# Patient Record
Sex: Male | Born: 1965 | Race: White | Hispanic: No | Marital: Married | Smoking: Never smoker
Health system: Southern US, Community
[De-identification: ages and names within clinical notes are randomized; demographics above are authoritative.]

## PROBLEM LIST (undated history)

## (undated) DIAGNOSIS — I10 Essential (primary) hypertension: Secondary | ICD-10-CM

## (undated) HISTORY — PX: VASECTOMY: SHX75

---

## 2012-03-06 LAB — HM COLONOSCOPY

## 2012-11-20 ENCOUNTER — Ambulatory Visit: Payer: Self-pay | Admitting: Family Medicine

## 2014-05-13 LAB — BASIC METABOLIC PANEL
BUN: 10 mg/dL (ref 4–21)
Creatinine: 0.7 mg/dL (ref 0.6–1.3)
GLUCOSE: 91 mg/dL
Potassium: 4.4 mmol/L (ref 3.4–5.3)
Sodium: 140 mmol/L (ref 137–147)

## 2014-05-13 LAB — CBC AND DIFFERENTIAL
HCT: 45 % (ref 41–53)
Hemoglobin: 15.6 g/dL (ref 13.5–17.5)
NEUTROS ABS: 5 /uL
PLATELETS: 380 10*3/uL (ref 150–399)
WBC: 9 10^3/mL

## 2014-05-13 LAB — HEPATIC FUNCTION PANEL
ALK PHOS: 109 U/L (ref 25–125)
ALT: 28 U/L (ref 10–40)
AST: 16 U/L (ref 14–40)
BILIRUBIN, TOTAL: 0.3 mg/dL

## 2014-05-13 LAB — TSH: TSH: 1.51 u[IU]/mL (ref 0.41–5.90)

## 2014-08-13 IMAGING — CR DG CHEST 2V
1 series · 2 of 2 positions shown · non-contrast
Comparison: none

REASON FOR EXAM: cough
COMMENTS:

[Series 1: pa · 0.17mm/px · 2 of 2 slices shown]
[im 1/2]
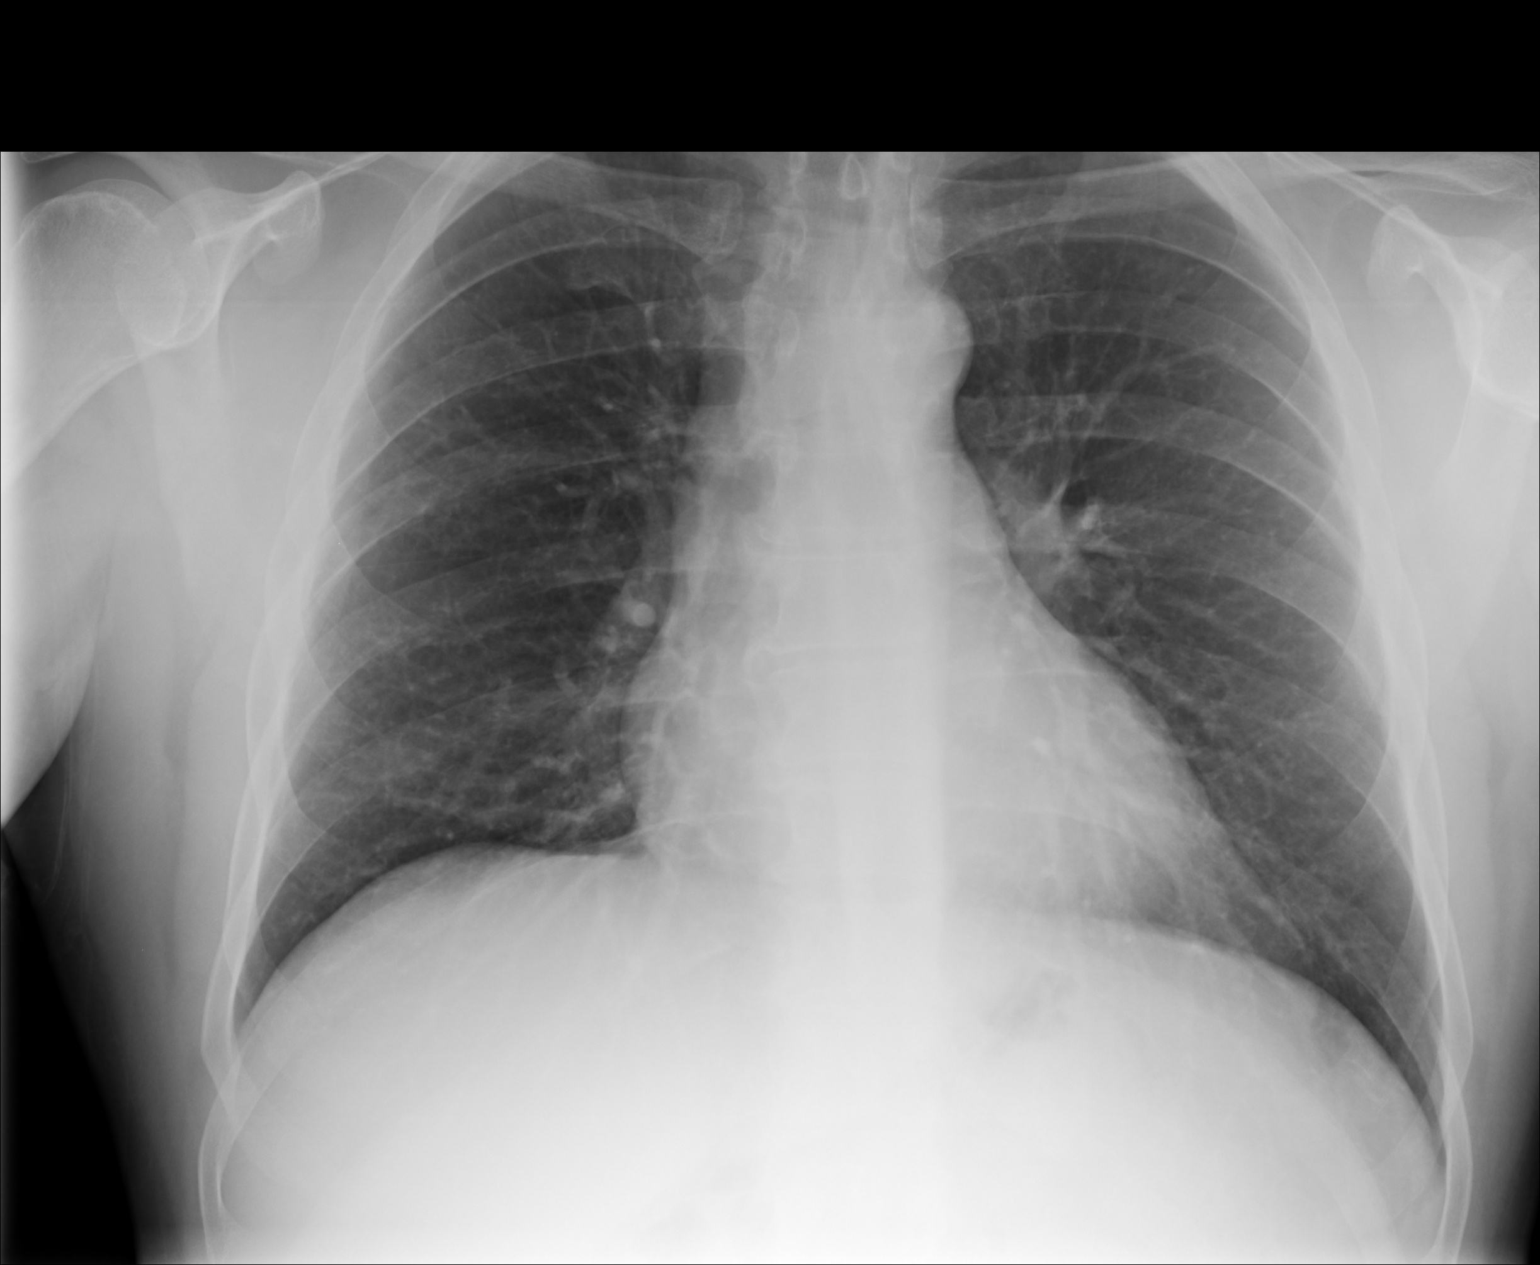
[im 2/2]
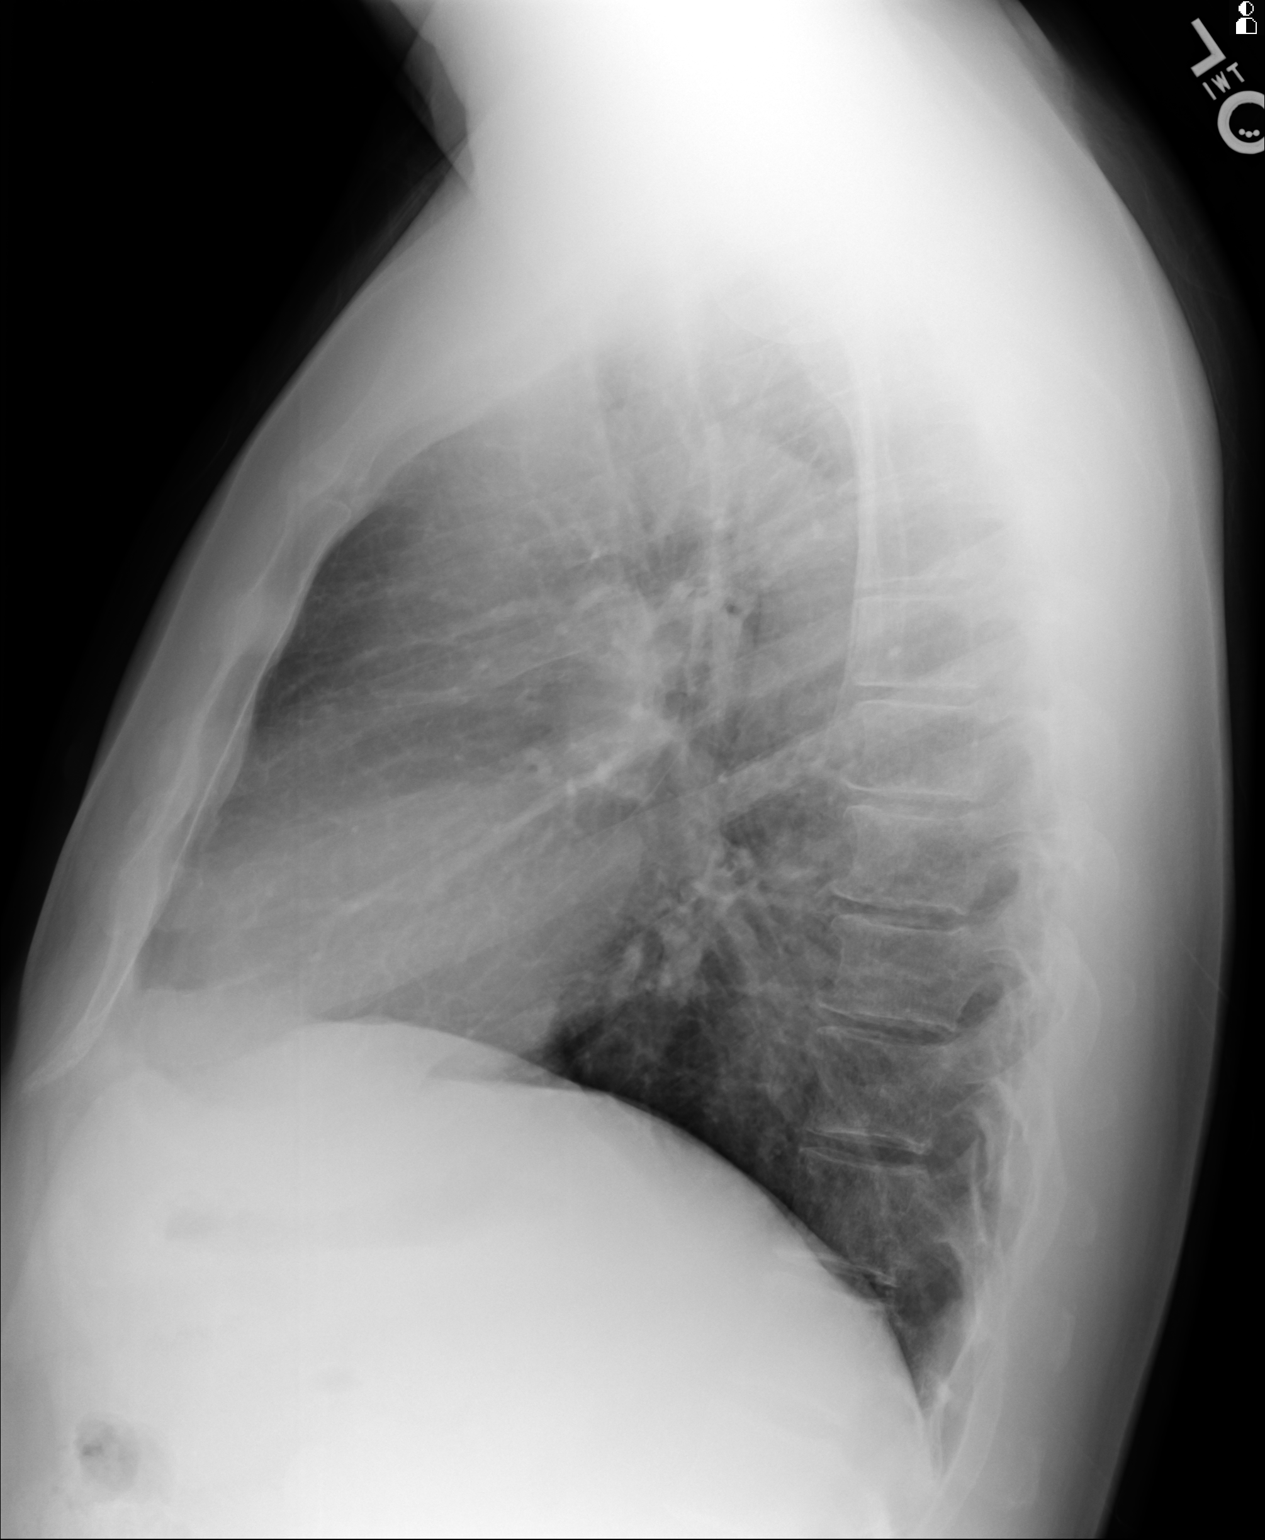

[2 of 2 positions shown; findings below may reference images not displayed]

PROCEDURE:     KDR - KDXR CHEST PA (OR AP) AND LAT  - November 20, 2012  [DATE]

RESULT:     The lungs are adequately inflated. There is no focal infiltrate.
The cardiac silhouette is normal in size. The mediastinum is normal in
width. There is no pleural effusion. The bony thorax exhibits no acute
abnormality.
IMPRESSION: There is no evidence of pneumonia nor CHF. I cannot exclude
acute bronchitis in the appropriate clinical setting.

[REDACTED]

## 2015-05-29 ENCOUNTER — Other Ambulatory Visit: Payer: Self-pay | Admitting: Family Medicine

## 2015-05-29 DIAGNOSIS — I10 Essential (primary) hypertension: Secondary | ICD-10-CM | POA: Insufficient documentation

## 2015-05-29 NOTE — Telephone Encounter (Signed)
Last OV 04/2014  Thanks,   -Tianna Baus  

## 2015-10-09 DIAGNOSIS — Z6827 Body mass index (BMI) 27.0-27.9, adult: Secondary | ICD-10-CM

## 2015-10-09 DIAGNOSIS — N529 Male erectile dysfunction, unspecified: Secondary | ICD-10-CM | POA: Insufficient documentation

## 2015-10-09 DIAGNOSIS — E7800 Pure hypercholesterolemia, unspecified: Secondary | ICD-10-CM | POA: Insufficient documentation

## 2015-10-09 DIAGNOSIS — E663 Overweight: Secondary | ICD-10-CM | POA: Insufficient documentation

## 2015-11-29 ENCOUNTER — Other Ambulatory Visit: Payer: Self-pay | Admitting: Family Medicine

## 2015-11-29 DIAGNOSIS — I10 Essential (primary) hypertension: Secondary | ICD-10-CM

## 2015-12-10 ENCOUNTER — Encounter: Payer: Self-pay | Admitting: Family Medicine

## 2015-12-10 ENCOUNTER — Ambulatory Visit (INDEPENDENT_AMBULATORY_CARE_PROVIDER_SITE_OTHER): Payer: BLUE CROSS/BLUE SHIELD | Admitting: Family Medicine

## 2015-12-10 VITALS — BP 120/84 | HR 84 | Temp 97.8°F | Resp 20 | Ht 68.0 in | Wt 187.0 lb

## 2015-12-10 DIAGNOSIS — Z23 Encounter for immunization: Secondary | ICD-10-CM | POA: Diagnosis not present

## 2015-12-10 DIAGNOSIS — E7800 Pure hypercholesterolemia, unspecified: Secondary | ICD-10-CM | POA: Diagnosis not present

## 2015-12-10 DIAGNOSIS — I10 Essential (primary) hypertension: Secondary | ICD-10-CM | POA: Diagnosis not present

## 2015-12-10 DIAGNOSIS — Z0000 Encounter for general adult medical examination without abnormal findings: Secondary | ICD-10-CM

## 2015-12-10 NOTE — Progress Notes (Signed)
Patient ID: Ryan Ceo., male   DOB: [birth year], 50 y.o.   MRN: [id number]       Patient: Ryan Ayala., Male    DOB: 1966/01/24, 50 y.o.   MRN: [id number] Visit Date: 12/10/2015  Today's Provider: Lorie Phenix, MD   Chief Complaint  Patient presents with  . Annual Exam   Subjective:    Annual physical exam Ryan Leclaire. is a 50 y.o. male who presents today for health maintenance and complete physical. He feels well. He reports exercising daily. Has very physical job at [employer]. Gets 30 to 40k steps in daily. He reports he is sleeping well.  11/29/11 CPE -----------------------------------------------------------------   Review of Systems  Constitutional: Negative.   HENT: Negative.   Eyes: Negative.   Respiratory: Negative.   Cardiovascular: Negative.   Gastrointestinal: Negative.   Endocrine: Negative.   Genitourinary: Negative.   Musculoskeletal: Negative.   Skin: Negative.   Allergic/Immunologic: Negative.   Neurological: Negative.   Hematological: Negative.   Psychiatric/Behavioral: Negative.     Social History      He  reports that he has never smoked. He has never used smokeless tobacco. He reports that he does not drink alcohol or use illicit drugs.       Social History   Social History  . Marital Status: Married    Spouse Name: N/A  . Number of Children: N/A  . Years of Education: N/A   Social History Main Topics  . Smoking status: Never Smoker   . Smokeless tobacco: Never Used  . Alcohol Use: No  . Drug Use: No  . Sexual Activity: Not Asked   Other Topics Concern  . None   Social History Narrative    History reviewed. No pertinent past medical history.   Patient Active Problem List   Diagnosis Date Noted  . Body mass index (BMI) of 27.0-27.9 in adult 10/09/2015  . ED (erectile dysfunction) of organic origin 10/09/2015  . Hypercholesteremia 10/09/2015  . Essential (primary) hypertension 05/29/2015    Past Surgical History  Procedure  Laterality Date  . Vasectomy      Family History        Family Status  Relation Status Death Age  . Mother Alive   . Father Deceased 49  . Sister Alive   . Paternal Grandfather Deceased         His family history includes Cancer in his father; Heart attack in his paternal grandfather; Heart failure in his father.    No Known Allergies  Previous Medications   LISINOPRIL-HYDROCHLOROTHIAZIDE (PRINZIDE,ZESTORETIC) 20-25 MG TABLET    take 1 tablet by mouth once daily    Patient Care Team: Lorie Phenix, MD as PCP - General (Family Medicine)     Objective:   Vitals: BP 120/84 mmHg  Pulse 84  Temp(Src) 97.8 F (36.6 C) (Oral)  Resp 20  Ht  (1.727 m)  Wt 187 lb (84.823 kg)  BMI 28.44 kg/m2   Physical Exam  Constitutional: He is oriented to person, place, and time. He appears well-developed and well-nourished.  HENT:  Head: Normocephalic and atraumatic.  Right Ear: External ear normal.  Left Ear: External ear normal.  Nose: Nose normal.  Mouth/Throat: Oropharynx is clear and moist.  Eyes: Conjunctivae and EOM are normal. Pupils are equal, round, and reactive to light.  Neck: Normal range of motion. Neck supple.  Cardiovascular: Normal rate, regular rhythm and normal heart sounds.   Pulmonary/Chest: Effort normal and  breath sounds normal.  Abdominal: Soft. Bowel sounds are normal.  Musculoskeletal: Normal range of motion.  Neurological: He is alert and oriented to person, place, and time.  Skin: Skin is warm and dry.  Psychiatric: He has a normal mood and affect. His behavior is normal. Judgment and thought content normal.     Depression Screen PHQ 2/9 Scores 12/10/2015  PHQ - 2 Score 0      Assessment & Plan:     Routine Health Maintenance and Physical Exam  Exercise Activities and Dietary recommendations Goals    None      Immunization History  Administered Date(s) Administered  . Tdap 12/10/2015         1. Annual physical exam Stable.  Patient advised to continue eating healthy and exercise daily.  2. Need for Tdap vaccination - Tdap vaccine greater than or equal to 7yo IM  3. Essential (primary) hypertension - CBC with Differential/Platelet - Comprehensive metabolic panel  4. Hypercholesteremia - Lipid Panel With LDL/HDL Ratio - TSH   Patient seen and examined by Dr. Leo GrosserNancy J.. Alinah Sheard, and note scribed by Liz BeachSulibeya S. Dimas, CMA. I have reviewed the document for accuracy and completeness and I agree with above. Leo Grosser- Azaya Goedde J. Kennie Snedden, MD   Lorie PhenixNancy Hermenia Fritcher, MD    --------------------------------------------------------------------

## 2015-12-12 ENCOUNTER — Telehealth: Payer: Self-pay

## 2015-12-12 LAB — COMPREHENSIVE METABOLIC PANEL
ALK PHOS: 108 IU/L (ref 39–117)
ALT: 38 IU/L (ref 0–44)
AST: 20 IU/L (ref 0–40)
Albumin/Globulin Ratio: 1.6 (ref 1.2–2.2)
Albumin: 4.2 g/dL (ref 3.5–5.5)
BILIRUBIN TOTAL: 0.5 mg/dL (ref 0.0–1.2)
BUN/Creatinine Ratio: 18 (ref 9–20)
BUN: 13 mg/dL (ref 6–24)
CHLORIDE: 101 mmol/L (ref 96–106)
CO2: 22 mmol/L (ref 18–29)
Calcium: 9.8 mg/dL (ref 8.7–10.2)
Creatinine, Ser: 0.74 mg/dL — ABNORMAL LOW (ref 0.76–1.27)
GFR calc Af Amer: 126 mL/min/{1.73_m2} (ref >59)
GFR calc non Af Amer: 109 mL/min/{1.73_m2} (ref >59)
GLUCOSE: 95 mg/dL (ref 65–99)
Globulin, Total: 2.7 g/dL (ref 1.5–4.5)
Potassium: 4.7 mmol/L (ref 3.5–5.2)
Sodium: 142 mmol/L (ref 134–144)
Total Protein: 6.9 g/dL (ref 6.0–8.5)

## 2015-12-12 LAB — CBC WITH DIFFERENTIAL/PLATELET
BASOS ABS: 0.1 10*3/uL (ref 0.0–0.2)
Basos: 1 %
EOS (ABSOLUTE): 0.7 10*3/uL — AB (ref 0.0–0.4)
Eos: 8 %
Hematocrit: 47.2 % (ref 37.5–51.0)
Hemoglobin: 16.1 g/dL (ref 12.6–17.7)
Immature Grans (Abs): 0 10*3/uL (ref 0.0–0.1)
Immature Granulocytes: 0 %
LYMPHS ABS: 2.3 10*3/uL (ref 0.7–3.1)
LYMPHS: 25 %
MCH: 31 pg (ref 26.6–33.0)
MCHC: 34.1 g/dL (ref 31.5–35.7)
MCV: 91 fL (ref 79–97)
MONOS ABS: 0.8 10*3/uL (ref 0.1–0.9)
Monocytes: 9 %
NEUTROS ABS: 5.3 10*3/uL (ref 1.4–7.0)
Neutrophils: 57 %
PLATELETS: 368 10*3/uL (ref 150–379)
RBC: 5.19 x10E6/uL (ref 4.14–5.80)
RDW: 13.4 % (ref 12.3–15.4)
WBC: 9.3 10*3/uL (ref 3.4–10.8)

## 2015-12-12 LAB — LIPID PANEL WITH LDL/HDL RATIO
CHOLESTEROL TOTAL: 221 mg/dL — AB (ref 100–199)
HDL: 46 mg/dL (ref >39)
LDL Calculated: 147 mg/dL — ABNORMAL HIGH (ref 0–99)
LDl/HDL Ratio: 3.2 ratio units (ref 0.0–3.6)
Triglycerides: 140 mg/dL (ref 0–149)
VLDL CHOLESTEROL CAL: 28 mg/dL (ref 5–40)

## 2015-12-12 LAB — TSH: TSH: 1.95 u[IU]/mL (ref 0.450–4.500)

## 2015-12-12 NOTE — Telephone Encounter (Signed)
LMTCB 12/12/2015  Thanks,   -Elsworth Ledin  

## 2015-12-12 NOTE — Telephone Encounter (Signed)
Pt is returning call.  WU#[phone]/NFCB#[phone]/MW

## 2015-12-12 NOTE — Telephone Encounter (Signed)
LMTCB 12/12/2015  Thanks,   -Laura  

## 2015-12-12 NOTE — Telephone Encounter (Signed)
-----   Message from Lorie PhenixNancy Maloney, MD sent at 12/12/2015  3:15 PM EDT ----- Labs stable.   Cholesterol is elevated but 10 year risk of heart disease is at 4 %.  Eat healthy, stay active and recheck annually. Thanks.

## 2015-12-17 NOTE — Telephone Encounter (Signed)
Pt advised.   Thanks,   -Hanaa Payes  

## 2016-04-24 ENCOUNTER — Encounter: Payer: Self-pay | Admitting: Gynecology

## 2016-04-24 ENCOUNTER — Ambulatory Visit
Admission: EM | Admit: 2016-04-24 | Discharge: 2016-04-24 | Disposition: A | Discharge status: Home / Self Care | Payer: Self-pay | Attending: Emergency Medicine | Admitting: Emergency Medicine

## 2016-04-24 DIAGNOSIS — Z024 Encounter for examination for driving license: Secondary | ICD-10-CM

## 2016-04-24 DIAGNOSIS — Z029 Encounter for administrative examinations, unspecified: Secondary | ICD-10-CM

## 2016-04-24 HISTORY — DX: Essential (primary) hypertension: I10

## 2016-04-24 LAB — DEPT OF TRANSP DIPSTICK, URINE (ARMC ONLY)
Glucose, UA: NEGATIVE mg/dL
Protein, ur: NEGATIVE mg/dL
Specific Gravity, Urine: 1.015 (ref 1.005–1.030)

## 2016-04-24 NOTE — ED Provider Notes (Signed)
CSN: [id number]     Arrival date & time 04/24/16  [id number] History  [id number] First MD Initiated Contact with Patient 04/24/16 [phone]     Chief Complaint  Patient presents with  . DOT   (Consider location/radiation/quality/duration/timing/severity/associated sxs/prior Treatment) HPI   This a 50 year old male presents for a DOT physical. He drives for [employer]. He has a history of hypertension on medication with adequate control.  Past Medical History:  Diagnosis Date  . Hypertension    Past Surgical History:  Procedure Laterality Date  . VASECTOMY     Family History  Problem Relation Age of Onset  . Heart failure Father   . Cancer Father   . Heart attack Paternal Grandfather    Social History  Substance Use Topics  . Smoking status: Never Smoker  . Smokeless tobacco: Never Used  . Alcohol use No    Review of Systems  All other systems reviewed and are negative.   Allergies  Review of patient's allergies indicates no known allergies.  Home Medications   Prior to Admission medications   Medication Sig Start Date End Date Taking? Authorizing Provider  lisinopril-hydrochlorothiazide (PRINZIDE,ZESTORETIC) 20-25 MG tablet take 1 tablet by mouth once daily 12/01/15  Yes Lorie PhenixNancy Maloney, MD   Meds Ordered and Administered this Visit  Medications - No data to display  BP 135/90 (BP Location: Right Arm)   Pulse 80   Temp 98.6 F (37 C) (Oral)   Resp 16   Ht 5\' 9"  (1.753 m)   Wt 182 lb (82.6 kg)   SpO2 98%   BMI 26.88 kg/m  No data found.   Physical Exam  Constitutional:  See DOT physical sheet  Nursing note and vitals reviewed.   Urgent Care Course   Clinical Course    Procedures (including critical care time)  Labs Review Labs Reviewed  DEPT OF TRANSP DIPSTICK, URINE([facility] ONLY) - Abnormal; Notable for the following:       Result Value   Hgb urine dipstick TRACE (*)    All other components within normal limits    Imaging Review No results  found.   Visual Acuity Review  Right Eye Distance:   Left Eye Distance:   Bilateral Distance:    Right Eye Near: R Near: 2020 Left Eye Near:  L Near: 20/25 Bilateral Near:  20/20 (without corrective lens)      MDM   1. Driver's permit PE (physical examination)    Patient qualifies for one year DOT certificate due to hypertension on medication    Lutricia FeilWilliam P Roemer, PA-C 04/24/16 [id number]

## 2016-04-24 NOTE — ED Triage Notes (Signed)
Patient here for DOT exam.

## 2016-05-26 ENCOUNTER — Other Ambulatory Visit: Payer: Self-pay | Admitting: Family Medicine

## 2016-05-26 DIAGNOSIS — I10 Essential (primary) hypertension: Secondary | ICD-10-CM

## 2017-04-03 ENCOUNTER — Other Ambulatory Visit: Payer: Self-pay | Admitting: Family Medicine

## 2017-04-03 DIAGNOSIS — I10 Essential (primary) hypertension: Secondary | ICD-10-CM

## 2017-04-25 ENCOUNTER — Ambulatory Visit (INDEPENDENT_AMBULATORY_CARE_PROVIDER_SITE_OTHER): Payer: BLUE CROSS/BLUE SHIELD | Admitting: Family Medicine

## 2017-04-25 ENCOUNTER — Encounter: Payer: Self-pay | Admitting: Family Medicine

## 2017-04-25 VITALS — BP 124/86 | HR 76 | Temp 98.1°F | Resp 16 | Ht 69.0 in | Wt 183.0 lb

## 2017-04-25 DIAGNOSIS — Z114 Encounter for screening for human immunodeficiency virus [HIV]: Secondary | ICD-10-CM | POA: Diagnosis not present

## 2017-04-25 DIAGNOSIS — E7800 Pure hypercholesterolemia, unspecified: Secondary | ICD-10-CM | POA: Diagnosis not present

## 2017-04-25 DIAGNOSIS — I10 Essential (primary) hypertension: Secondary | ICD-10-CM

## 2017-04-25 DIAGNOSIS — Z6827 Body mass index (BMI) 27.0-27.9, adult: Secondary | ICD-10-CM | POA: Diagnosis not present

## 2017-04-25 MED ORDER — LISINOPRIL-HYDROCHLOROTHIAZIDE 20-25 MG PO TABS: 1.0000 | ORAL_TABLET | Freq: Every day | ORAL | 2 refills | Status: DC

## 2017-04-25 NOTE — Patient Instructions (Signed)

## 2017-04-25 NOTE — Assessment & Plan Note (Signed)
Discussed diet and exercise 

## 2017-04-25 NOTE — Assessment & Plan Note (Signed)
Well controlled Continue current meds Recheck CMP F/u in 6 months 

## 2017-04-25 NOTE — Assessment & Plan Note (Signed)
No meds currently Recheck lipid panel, CMP

## 2017-04-25 NOTE — Progress Notes (Signed)
Patient: Ryan Ayala. Male    DOB: 1965/08/21   51 y.o.   MRN: [id number] Visit Date: 04/25/2017  Today's Provider: Shirlee Latch, MD   Chief Complaint  Patient presents with  . Hypertension  . Hyperlipidemia   Subjective:    HPI      Hypertension, follow-up:  BP Readings from Last 3 Encounters:  04/25/17 124/86  04/24/16 135/90  12/10/15 120/84    He was last seen for hypertension over 1 year ago.  BP at that visit was 120/84. Management since that visit includes checking labs, which were stable. He reports good compliance with treatment. He is not having side effects.  He is exercising. Daily. Active at work as a Optician, dispensing. He is not adherent to low salt diet.   Outside blood pressures are not being checked. He is experiencing none.  Patient denies chest pain, chest pressure/discomfort, claudication, dyspnea, exertional chest pressure/discomfort, fatigue, irregular heart beat, lower extremity edema, near-syncope, orthopnea, palpitations and syncope.   Cardiovascular risk factors include dyslipidemia, hypertension and male gender.  Use of agents associated with hypertension: none.     Weight trend: fluctuating a bit Wt Readings from Last 3 Encounters:  04/25/17 183 lb (83 kg)  04/24/16 182 lb (82.6 kg)  12/10/15 187 lb (84.8 kg)    Current diet: in general, a "healthy" diet    ------------------------------------------------------------------------  Lipid/Cholesterol, Follow-up:   Last seen for this over 1 year ago.  Management changes since that visit include checking labs. Lipids were elevated, but ASCVD was 4%. No changes made. . Last Lipid Panel:    Component Value Date/Time   CHOL 221 (H) 12/11/2015 0850   TRIG 140 12/11/2015 0850   HDL 46 12/11/2015 0850   LDLCALC 147 (H) 12/11/2015 0850    Risk factors for vascular disease include hypercholesterolemia and  hypertension  -------------------------------------------------------------------   No Known Allergies   Current Outpatient Prescriptions:  .  lisinopril-hydrochlorothiazide (PRINZIDE,ZESTORETIC) 20-25 MG tablet, TAKE 1 TABLET BY MOUTH ONCE DAILY, Disp: 30 tablet, Rfl: 0  Review of Systems  Constitutional: Negative for activity change, appetite change, chills, diaphoresis, fatigue, fever and unexpected weight change.  HENT: Negative.   Respiratory: Negative for cough, chest tightness and shortness of breath.   Cardiovascular: Negative for chest pain, palpitations and leg swelling.  Gastrointestinal: Negative.   Endocrine: Negative.   Genitourinary: Negative.   Musculoskeletal: Negative.   Neurological: Negative.   Psychiatric/Behavioral: Negative.     Social History  Substance Use Topics  . Smoking status: Never Smoker  . Smokeless tobacco: Never Used  . Alcohol use Yes     Comment: rarely   Objective:   BP 124/86 (BP Location: Left Arm, Patient Position: Sitting, Cuff Size: Normal)   Pulse 76   Temp 98.1 F (36.7 C) (Oral)   Resp 16   Ht  (1.753 m)   Wt 183 lb (83 kg)   BMI 27.02 kg/m  Vitals:   04/25/17 0815  BP: 124/86  Pulse: 76  Resp: 16  Temp: 98.1 F (36.7 C)  TempSrc: Oral  Weight: 183 lb (83 kg)  Height:  (1.753 m)     Physical Exam  Constitutional: He is oriented to person, place, and time. He appears well-developed and well-nourished. No distress.  HENT:  Head: Normocephalic and atraumatic.  Right Ear: External ear normal.  Left Ear: External ear normal.  Nose: Nose normal.  Eyes: Conjunctivae are normal. No scleral icterus.  Cardiovascular: Normal  rate, regular rhythm, normal heart sounds and intact distal pulses.   No murmur heard. Pulmonary/Chest: Effort normal and breath sounds normal. No respiratory distress. He has no wheezes. He has no rales.  Musculoskeletal: He exhibits no edema or deformity.  Neurological: He is oriented  to person, place, and time.  Skin: Skin is warm and dry. No rash noted.  Psychiatric: He has a normal mood and affect. His behavior is normal.  Vitals reviewed.      Assessment & Plan:      Problem List Items Addressed This Visit      Cardiovascular and Mediastinum   Essential (primary) hypertension    Well controlled Continue current meds Recheck CMP F/u in 6 months      Relevant Medications   sildenafil (VIAGRA) 50 MG tablet   lisinopril-hydrochlorothiazide (PRINZIDE,ZESTORETIC) 20-25 MG tablet   Other Relevant Orders   Comprehensive metabolic panel     Other   Body mass index (BMI) of 27.0-27.9 in adult    Discussed diet and exercise      Hypercholesteremia - Primary    No meds currently Recheck lipid panel, CMP      Relevant Medications   sildenafil (VIAGRA) 50 MG tablet   lisinopril-hydrochlorothiazide (PRINZIDE,ZESTORETIC) 20-25 MG tablet   Other Relevant Orders   Lipid panel   Comprehensive metabolic panel    Other Visit Diagnoses    Screening for HIV (human immunodeficiency virus)       Relevant Orders   HIV antibody (with reflex)          The entirety of the information documented in the History of Present Illness, Review of Systems and Physical Exam were personally obtained by me. Portions of this information were initially documented by Irving BurtonEmily Ratchford, CMA and reviewed by me for thoroughness and accuracy.     Shirlee LatchAngela Ceanna Wareing, MD  [facility]

## 2017-04-26 ENCOUNTER — Telehealth: Payer: Self-pay

## 2017-04-26 LAB — HIV ANTIBODY (ROUTINE TESTING W REFLEX): HIV 1&2 Ab, 4th Generation: NONREACTIVE

## 2017-04-26 LAB — LIPID PANEL
CHOL/HDL RATIO: 4.9 (calc) (ref <5.0)
CHOLESTEROL: 209 mg/dL — AB (ref <200)
HDL: 43 mg/dL (ref >40)
LDL CHOLESTEROL (CALC): 142 mg/dL — AB
Non-HDL Cholesterol (Calc): 166 mg/dL (calc) — ABNORMAL HIGH (ref <130)
TRIGLYCERIDES: 122 mg/dL (ref <150)

## 2017-04-26 LAB — COMPREHENSIVE METABOLIC PANEL
AG Ratio: 1.5 (calc) (ref 1.0–2.5)
ALT: 28 U/L (ref 9–46)
AST: 17 U/L (ref 10–35)
Albumin: 4.1 g/dL (ref 3.6–5.1)
Alkaline phosphatase (APISO): 101 U/L (ref 40–115)
BUN: 11 mg/dL (ref 7–25)
CO2: 28 mmol/L (ref 20–32)
Calcium: 9.6 mg/dL (ref 8.6–10.3)
Chloride: 103 mmol/L (ref 98–110)
Creat: 0.7 mg/dL (ref 0.70–1.33)
Globulin: 2.8 g/dL (calc) (ref 1.9–3.7)
Glucose, Bld: 91 mg/dL (ref 65–99)
Potassium: 4.3 mmol/L (ref 3.5–5.3)
Sodium: 137 mmol/L (ref 135–146)
Total Bilirubin: 0.6 mg/dL (ref 0.2–1.2)
Total Protein: 6.9 g/dL (ref 6.1–8.1)

## 2017-04-26 NOTE — Telephone Encounter (Signed)
Pt advised of results. 

## 2017-04-26 NOTE — Telephone Encounter (Signed)
-----   Message from Erasmo DownerAngela M Bacigalupo, MD sent at 04/26/2017 10:27 AM EDT ----- Cholesterol is pretty good but slightly elevated.  10 year risk of heart disease/stroke is low at 2.3%. No statin medication necessary at this time.  Normal kidney function, liver function, electrolytes.  HIV screen negative.  Erasmo DownerBacigalupo, Angela M, MD, MPH [facility] 04/26/2017 10:27 AM

## 2017-04-28 ENCOUNTER — Encounter: Payer: Self-pay | Admitting: Family Medicine

## 2017-10-24 ENCOUNTER — Encounter: Payer: Self-pay | Admitting: Family Medicine

## 2017-10-24 ENCOUNTER — Ambulatory Visit (INDEPENDENT_AMBULATORY_CARE_PROVIDER_SITE_OTHER): Payer: BLUE CROSS/BLUE SHIELD | Admitting: Family Medicine

## 2017-10-24 VITALS — BP 136/84 | HR 86 | Temp 98.2°F | Resp 16 | Ht 69.0 in | Wt 187.0 lb

## 2017-10-24 DIAGNOSIS — I10 Essential (primary) hypertension: Secondary | ICD-10-CM

## 2017-10-24 DIAGNOSIS — Z6827 Body mass index (BMI) 27.0-27.9, adult: Secondary | ICD-10-CM

## 2017-10-24 DIAGNOSIS — Z0000 Encounter for general adult medical examination without abnormal findings: Secondary | ICD-10-CM | POA: Diagnosis not present

## 2017-10-24 NOTE — Progress Notes (Signed)
Patient: Ryan Ayala., Male    DOB: [birth year], 52 y.o.   MRN: [id number] Visit Date: 10/24/2017  Today's Provider: Shirlee Latch, MD   I, Joslyn Hy, CMA, am acting as scribe for Shirlee Latch, MD.  Chief Complaint  Patient presents with  . Annual Exam   Subjective:    Annual physical exam Ryan Ayala. is a 52 y.o. male who presents today for health maintenance and complete physical. He feels well. He reports he is not exercising regularly, but has a physically strenuous job.Marland Kitchen He reports he is sleeping well.  Last colonoscopy- 03/06/2012- entire examined colon was normal. -----------------------------------------------------------------   Review of Systems  Constitutional: Negative.   HENT: Negative.   Eyes: Negative.   Respiratory: Negative.   Cardiovascular: Negative.   Gastrointestinal: Negative.   Endocrine: Negative.   Genitourinary: Negative.   Musculoskeletal: Negative.   Skin: Negative.   Allergic/Immunologic: Negative.   Neurological: Negative.   Hematological: Negative.   Psychiatric/Behavioral: Negative.     Social History      He  reports that  has never smoked. he has never used smokeless tobacco. He reports that he drinks alcohol. He reports that he does not use drugs.       Social History   Socioeconomic History  . Marital status: Married    Spouse name: Zella Ball  . Number of children: 3  . Years of education: 95  . Highest education level: High school graduate  Social Needs  . Financial resource strain: Not hard at all  . Food insecurity - worry: Never true  . Food insecurity - inability: Never true  . Transportation needs - medical: No  . Transportation needs - non-medical: No  Occupational History  . None  Tobacco Use  . Smoking status: Never Smoker  . Smokeless tobacco: Never Used  Substance and Sexual Activity  . Alcohol use: Yes    Comment: rarely  . Drug use: No  . Sexual activity: Yes    Birth  control/protection: Surgical  Other Topics Concern  . None  Social History Narrative  . None    Past Medical History:  Diagnosis Date  . Hypertension      Patient Active Problem List   Diagnosis Date Noted  . Body mass index (BMI) of 27.0-27.9 in adult 10/09/2015  . ED (erectile dysfunction) of organic origin 10/09/2015  . Hypercholesteremia 10/09/2015  . Essential (primary) hypertension 05/29/2015    Past Surgical History:  Procedure Laterality Date  . VASECTOMY      Family History        Family Status  Relation Name Status  . Mother  Alive  . Father  Deceased at age 36  . Sister  Alive  . PGF  Deceased  . Neg Hx  (Not Specified)        His family history includes Cancer in his father; Emphysema in his mother; Healthy in his sister; Heart attack (age of onset: 46) in his paternal grandfather; Heart failure in his father. There is no history of Colon cancer or Prostate cancer.      No Known Allergies   Current Outpatient Medications:  .  lisinopril-hydrochlorothiazide (PRINZIDE,ZESTORETIC) 20-25 MG tablet, Take 1 tablet by mouth daily., Disp: 90 tablet, Rfl: 2 .  sildenafil (VIAGRA) 50 MG tablet, Take 50 mg by mouth daily as needed., Disp: , Rfl:    Patient Care Team: Erasmo Downer, MD as PCP - General (Family Medicine)  Objective:   Vitals: BP 136/84 (BP Location: Left Arm, Patient Position: Sitting, Cuff Size: Large)   Pulse 86   Temp 98.2 F (36.8 C) (Oral)   Resp 16   Ht 5\' 9"  (1.753 m)   Wt 187 lb (84.8 kg)   SpO2 98%   BMI 27.62 kg/m    Vitals:   10/24/17 1003  BP: 136/84  Pulse: 86  Resp: 16  Temp: 98.2 F (36.8 C)  TempSrc: Oral  SpO2: 98%  Weight: 187 lb (84.8 kg)  Height: 5\' 9"  (1.753 m)     Physical Exam  Constitutional: He is oriented to person, place, and time. He appears well-developed and well-nourished. No distress.  HENT:  Head: Normocephalic and atraumatic.  Right Ear: External ear normal.  Left Ear:  External ear normal.  Nose: Nose normal.  Mouth/Throat: Oropharynx is clear and moist. No oropharyngeal exudate.  Eyes: Conjunctivae and EOM are normal. Pupils are equal, round, and reactive to light. Right eye exhibits no discharge. Left eye exhibits no discharge. No scleral icterus.  Neck: Neck supple. No thyromegaly present.  Cardiovascular: Normal rate, regular rhythm, normal heart sounds and intact distal pulses.  No murmur heard. Pulmonary/Chest: Effort normal and breath sounds normal. No respiratory distress. He has no wheezes. He has no rales.  Abdominal: Soft. He exhibits no distension. There is no tenderness.  Musculoskeletal: He exhibits no edema.  Lymphadenopathy:    He has no cervical adenopathy.  Neurological: He is alert and oriented to person, place, and time.  Skin: Skin is warm and dry. No rash noted.  Psychiatric: He has a normal mood and affect. His behavior is normal.  Vitals reviewed.    Depression Screen PHQ 2/9 Scores 10/24/2017 04/25/2017 12/10/2015  PHQ - 2 Score 0 0 0     Assessment & Plan:     Routine Health Maintenance and Physical Exam  Exercise Activities and Dietary recommendations Goals    None      Immunization History  Administered Date(s) Administered  . Tdap 12/10/2015    Health Maintenance  Topic Date Due  . INFLUENZA VACCINE  11/13/2017 (Originally 03/16/2017)  . COLONOSCOPY  03/06/2022  . TETANUS/TDAP  12/09/2025  . HIV Screening  Completed     Discussed health benefits of physical activity, and encouraged him to engage in regular exercise appropriate for his age and condition.    -------------------------------------------------------------------- Problem List Items Addressed This Visit      Cardiovascular and Mediastinum   Essential (primary) hypertension   Relevant Orders   Basic Metabolic Panel (BMET)     Other   Body mass index (BMI) of 27.0-27.9 in adult    Other Visit Diagnoses    Encounter for annual physical  exam    -  Primary       Return in about 6 months (around 04/26/2018) for BP, cholesterol f/u.   The entirety of the information documented in the History of Present Illness, Review of Systems and Physical Exam were personally obtained by me. Portions of this information were initially documented by Irving BurtonEmily Ratchford, CMA and reviewed by me for thoroughness and accuracy.    Erasmo DownerBacigalupo, Angela M, MD, MPH [facility] 10/24/2017 10:28 AM

## 2017-10-24 NOTE — Patient Instructions (Signed)

## 2017-10-25 ENCOUNTER — Telehealth: Payer: Self-pay

## 2017-10-25 LAB — BASIC METABOLIC PANEL
BUN / CREAT RATIO: 19 (ref 9–20)
BUN: 13 mg/dL (ref 6–24)
CHLORIDE: 101 mmol/L (ref 96–106)
CO2: 23 mmol/L (ref 20–29)
Calcium: 9.8 mg/dL (ref 8.7–10.2)
Creatinine, Ser: 0.68 mg/dL — ABNORMAL LOW (ref 0.76–1.27)
GFR calc non Af Amer: 111 mL/min/{1.73_m2} (ref >59)
GFR, EST AFRICAN AMERICAN: 129 mL/min/{1.73_m2} (ref >59)
GLUCOSE: 83 mg/dL (ref 65–99)
Potassium: 4.3 mmol/L (ref 3.5–5.2)
SODIUM: 139 mmol/L (ref 134–144)

## 2017-10-25 NOTE — Telephone Encounter (Signed)
-----   Message from Erasmo DownerAngela M Bacigalupo, MD sent at 10/25/2017  8:13 AM EDT ----- Normal kidney function, electrolytes.  Erasmo DownerBacigalupo, Angela M, MD, MPH [facility] 10/25/2017 8:13 AM

## 2017-10-25 NOTE — Telephone Encounter (Signed)
Pt advised.

## 2018-03-07 ENCOUNTER — Other Ambulatory Visit: Payer: Self-pay | Admitting: Family Medicine

## 2018-03-07 DIAGNOSIS — I10 Essential (primary) hypertension: Secondary | ICD-10-CM

## 2018-06-19 ENCOUNTER — Ambulatory Visit: Payer: BLUE CROSS/BLUE SHIELD | Admitting: Family Medicine

## 2018-06-19 ENCOUNTER — Encounter: Payer: Self-pay | Admitting: Family Medicine

## 2018-06-19 VITALS — BP 138/82 | HR 100 | Temp 98.1°F | Wt 187.6 lb

## 2018-06-19 DIAGNOSIS — I10 Essential (primary) hypertension: Secondary | ICD-10-CM | POA: Diagnosis not present

## 2018-06-19 DIAGNOSIS — Z6827 Body mass index (BMI) 27.0-27.9, adult: Secondary | ICD-10-CM | POA: Diagnosis not present

## 2018-06-19 DIAGNOSIS — N529 Male erectile dysfunction, unspecified: Secondary | ICD-10-CM | POA: Diagnosis not present

## 2018-06-19 DIAGNOSIS — E7800 Pure hypercholesterolemia, unspecified: Secondary | ICD-10-CM

## 2018-06-19 NOTE — Assessment & Plan Note (Signed)
Well controlled Continue current meds Check CMP F/u in 6 months 

## 2018-06-19 NOTE — Assessment & Plan Note (Signed)
Stable Continue viagra prn 

## 2018-06-19 NOTE — Progress Notes (Signed)
Patient: Ryan Ayala. Male    DOB: [birth year]   52 y.o.   MRN: [id number] Visit Date: 06/19/2018  Today's Provider: Shirlee Latch, MD   Chief Complaint  Patient presents with  . Hypertension  . Hyperlipidemia   Subjective:    HPI  Hypertension, follow-up:  BP Readings from Last 3 Encounters:  06/19/18 138/82  10/24/17 136/84  04/25/17 124/86    He was last seen for hypertension 6 months ago. BP at that visit was 136/84. Management since that visit includes continue current medications He reports good compliance with treatment. He is not having side effects.  He is exercising. Active at work as a Optician, dispensing. He is not adherent to low salt diet.   Outside blood pressures are not being checked. He is experiencing none.  Patient denies chest pain, chest pressure/discomfort, claudication, dyspnea, exertional chest pressure/discomfort, fatigue, irregular heart beat, lower extremity edema, near-syncope, orthopnea, palpitations and syncope.   Cardiovascular risk factors include dyslipidemia, hypertension and male gender.  Use of agents associated with hypertension: none.                Weight trend: fluctuating a bit Wt Readings from Last 3 Encounters:  06/19/18 187 lb 9.6 oz (85.1 kg)  10/24/17 187 lb (84.8 kg)  04/25/17 183 lb (83 kg)    Current diet: in general, a "healthy" diet    ------------------------------------------------------------------------  Lipid/Cholesterol, Follow-up:   Last seen for this over 6 months ago Management changes since that visit include checking labs.  Last Lipid Panel: Lab Results  Component Value Date   CHOL 209 (H) 04/25/2017   HDL 43 04/25/2017   LDLCALC 142 (H) 04/25/2017   TRIG 122 04/25/2017   CHOLHDL 4.9 04/25/2017    Risk factors for vascular disease include hypercholesterolemia and hypertension  -------------------------------------------------------------------   No Known  Allergies   Current Outpatient Medications:  .  lisinopril-hydrochlorothiazide (PRINZIDE,ZESTORETIC) 20-25 MG tablet, TAKE 1 TABLET BY MOUTH DAILY, Disp: 90 tablet, Rfl: 3 .  sildenafil (VIAGRA) 50 MG tablet, Take 50 mg by mouth daily as needed., Disp: , Rfl:   Review of Systems  Constitutional: Negative.   Respiratory: Negative.   Cardiovascular: Negative.   Gastrointestinal: Negative.   Genitourinary: Negative.   Musculoskeletal: Negative.     Social History   Tobacco Use  . Smoking status: Never Smoker  . Smokeless tobacco: Never Used  Substance Use Topics  . Alcohol use: Yes    Comment: rarely   Objective:   BP 138/82 (BP Location: Right Arm, Patient Position: Sitting, Cuff Size: Normal)   Pulse 100   Temp 98.1 F (36.7 C) (Oral)   Wt 187 lb 9.6 oz (85.1 kg)   SpO2 98%   BMI 27.70 kg/m  Vitals:   06/19/18 0806  BP: 138/82  Pulse: 100  Temp: 98.1 F (36.7 C)  TempSrc: Oral  SpO2: 98%  Weight: 187 lb 9.6 oz (85.1 kg)     Physical Exam  Constitutional: He is oriented to person, place, and time. He appears well-developed and well-nourished. No distress.  HENT:  Head: Normocephalic and atraumatic.  Eyes: Conjunctivae are normal. No scleral icterus.  Neck: Neck supple. No thyromegaly present.  Cardiovascular: Normal rate, regular rhythm, normal heart sounds and intact distal pulses.  No murmur heard. Pulmonary/Chest: Effort normal and breath sounds normal. No respiratory distress. He has no wheezes. He has no rales.  Musculoskeletal: He exhibits no edema.  Lymphadenopathy:    He  has no cervical adenopathy.  Neurological: He is alert and oriented to person, place, and time.  Skin: Skin is warm and dry. Capillary refill takes less than 2 seconds. No rash noted.  Psychiatric: He has a normal mood and affect. His behavior is normal.  Vitals reviewed.       Assessment & Plan:   Problem List Items Addressed This Visit      Cardiovascular and Mediastinum    Essential (primary) hypertension - Primary    Well controlled Continue current meds Check CMP F/u in 6 months      Relevant Orders   Comprehensive metabolic panel     Genitourinary   ED (erectile dysfunction) of organic origin    Stable Continue viagra prn        Other   Body mass index (BMI) of 27.0-27.9 in adult    Discussed diet and exercise and goal of 5-10% weight loss in next year      Hypercholesteremia    No meds currently  Last ASCVD risk 2.3% Recheck CMP and FLP      Relevant Orders   Comprehensive metabolic panel   Lipid panel       Return in about 6 months (around 12/18/2018) for CPE.   The entirety of the information documented in the History of Present Illness, Review of Systems and Physical Exam were personally obtained by me. Portions of this information were initially documented by Presley Raddle, CMA and reviewed by me for thoroughness and accuracy.    Erasmo Downer, MD, MPH [facility] 06/19/2018 9:01 AM

## 2018-06-19 NOTE — Patient Instructions (Signed)

## 2018-06-19 NOTE — Assessment & Plan Note (Signed)
No meds currently  Last ASCVD risk 2.3% Recheck CMP and FLP

## 2018-06-19 NOTE — Assessment & Plan Note (Signed)
Discussed diet and exercise and goal of 5-10% weight loss in next year

## 2018-06-20 LAB — COMPREHENSIVE METABOLIC PANEL
ALBUMIN: 4.2 g/dL (ref 3.5–5.5)
ALK PHOS: 111 IU/L (ref 39–117)
ALT: 40 IU/L (ref 0–44)
AST: 25 IU/L (ref 0–40)
Albumin/Globulin Ratio: 1.5 (ref 1.2–2.2)
BILIRUBIN TOTAL: 0.4 mg/dL (ref 0.0–1.2)
BUN / CREAT RATIO: 15 (ref 9–20)
BUN: 10 mg/dL (ref 6–24)
CHLORIDE: 103 mmol/L (ref 96–106)
CO2: 20 mmol/L (ref 20–29)
CREATININE: 0.68 mg/dL — AB (ref 0.76–1.27)
Calcium: 9.6 mg/dL (ref 8.7–10.2)
GFR calc non Af Amer: 111 mL/min/{1.73_m2} (ref >59)
GFR, EST AFRICAN AMERICAN: 128 mL/min/{1.73_m2} (ref >59)
Globulin, Total: 2.8 g/dL (ref 1.5–4.5)
Glucose: 83 mg/dL (ref 65–99)
Potassium: 4.4 mmol/L (ref 3.5–5.2)
SODIUM: 140 mmol/L (ref 134–144)
Total Protein: 7 g/dL (ref 6.0–8.5)

## 2018-06-20 LAB — LIPID PANEL
CHOLESTEROL TOTAL: 206 mg/dL — AB (ref 100–199)
Chol/HDL Ratio: 5.3 ratio — ABNORMAL HIGH (ref 0.0–5.0)
HDL: 39 mg/dL — ABNORMAL LOW (ref >39)
LDL CALC: 134 mg/dL — AB (ref 0–99)
Triglycerides: 167 mg/dL — ABNORMAL HIGH (ref 0–149)
VLDL CHOLESTEROL CAL: 33 mg/dL (ref 5–40)

## 2018-10-23 ENCOUNTER — Encounter: Payer: BLUE CROSS/BLUE SHIELD | Admitting: Family Medicine

## 2018-12-25 ENCOUNTER — Encounter: Payer: BLUE CROSS/BLUE SHIELD | Admitting: Family Medicine

## 2019-03-09 ENCOUNTER — Other Ambulatory Visit: Payer: Self-pay | Admitting: Family Medicine

## 2019-03-09 DIAGNOSIS — I10 Essential (primary) hypertension: Secondary | ICD-10-CM

## 2019-04-03 ENCOUNTER — Ambulatory Visit (INDEPENDENT_AMBULATORY_CARE_PROVIDER_SITE_OTHER): Payer: BC Managed Care – PPO | Admitting: Family Medicine

## 2019-04-03 ENCOUNTER — Other Ambulatory Visit: Payer: Self-pay

## 2019-04-03 VITALS — BP 128/82 | HR 88 | Temp 98.2°F | Ht 69.0 in | Wt 180.0 lb

## 2019-04-03 DIAGNOSIS — E663 Overweight: Secondary | ICD-10-CM

## 2019-04-03 DIAGNOSIS — Z0000 Encounter for general adult medical examination without abnormal findings: Secondary | ICD-10-CM | POA: Diagnosis not present

## 2019-04-03 DIAGNOSIS — I10 Essential (primary) hypertension: Secondary | ICD-10-CM

## 2019-04-03 DIAGNOSIS — N529 Male erectile dysfunction, unspecified: Secondary | ICD-10-CM | POA: Diagnosis not present

## 2019-04-03 DIAGNOSIS — E7800 Pure hypercholesterolemia, unspecified: Secondary | ICD-10-CM | POA: Diagnosis not present

## 2019-04-03 NOTE — Assessment & Plan Note (Signed)
Well controlled Continue current medications Recheck metabolic panel F/u in 6 months  

## 2019-04-03 NOTE — Assessment & Plan Note (Signed)
Not on statin Recheck CMP and FLP Calculate ASCVD risk to determine need for statin for primary prevention

## 2019-04-03 NOTE — Progress Notes (Signed)
Patient: Ryan CeoGary L Pherigo Jr., Male    DOB: 03/16/1966, 53 y.o.   MRN: [id number] Visit Date: 04/03/2019  Today's Provider: Shirlee LatchAngela Rishab Stoudt, MD   Chief Complaint  Patient presents with  . Annual Exam   Subjective:  I, Porsha McClurkin CMA, am acting as a scribe for Shirlee LatchAngela Magan Winnett, MD.    Annual physical exam Ryan CeoGary L Wyke Jr. is a 53 y.o. male who presents today for health maintenance and complete physical. He feels well. He reports exercising walking. He reports he is sleeping well.  ----------------------------------------------------------------- Last colonoscopy: 03/06/2012 - Normal - repeat in 10 years  Review of Systems  Constitutional: Negative.   HENT: Negative.   Eyes: Negative.   Respiratory: Negative.   Cardiovascular: Negative.   Gastrointestinal: Negative.   Endocrine: Negative.   Genitourinary: Negative.   Musculoskeletal: Negative.   Skin: Negative.   Allergic/Immunologic: Negative.   Neurological: Negative.   Hematological: Negative.   Psychiatric/Behavioral: Negative.     Social History He  reports that he has never smoked. He has never used smokeless tobacco. He reports current alcohol use. He reports that he does not use drugs. Social History   Socioeconomic History  . Marital status: Married    Spouse name: Ryan BallRobin  . Number of children: 3  . Years of education: 7112  . Highest education level: High school graduate  Occupational History  . Not on file  Social Needs  . Financial resource strain: Not hard at all  . Food insecurity    Worry: Never true    Inability: Never true  . Transportation needs    Medical: No    Non-medical: No  Tobacco Use  . Smoking status: Never Smoker  . Smokeless tobacco: Never Used  Substance and Sexual Activity  . Alcohol use: Yes    Comment: rarely  . Drug use: No  . Sexual activity: Yes    Birth control/protection: Surgical  Lifestyle  . Physical activity    Days per week: Not on file    Minutes per  session: Not on file  . Stress: Not on file  Relationships  . Social Musicianconnections    Talks on phone: Not on file    Gets together: Not on file    Attends religious service: Not on file    Active member of club or organization: Not on file    Attends meetings of clubs or organizations: Not on file    Relationship status: Not on file  Other Topics Concern  . Not on file  Social History Narrative  . Not on file    Patient Active Problem List   Diagnosis Date Noted  . Overweight 10/09/2015  . ED (erectile dysfunction) of organic origin 10/09/2015  . Hypercholesteremia 10/09/2015  . Essential (primary) hypertension 05/29/2015    Past Surgical History:  Procedure Laterality Date  . VASECTOMY      Family History  Family Status  Relation Name Status  . Mother  Alive  . Father  Deceased at age 52  . Sister  Alive  . PGF  Deceased  . Neg Hx  (Not Specified)   His family history includes Cancer in his father; Emphysema in his mother; Healthy in his sister; Heart attack (age of onset: 1256) in his paternal grandfather; Heart failure in his father.     No Known Allergies  Previous Medications   LISINOPRIL-HYDROCHLOROTHIAZIDE (ZESTORETIC) 20-25 MG TABLET    TAKE 1 TABLET BY MOUTH DAILY   SILDENAFIL (VIAGRA)  50 MG TABLET    Take 50 mg by mouth daily as needed.    Patient Care Team: Erasmo DownerBacigalupo, Sholom Dulude M, MD as PCP - General (Family Medicine)      Objective:   Vitals: BP 128/82 (BP Location: Left Arm, Patient Position: Sitting, Cuff Size: Normal)   Pulse 88   Temp 98.2 F (36.8 C) (Oral)   Ht 5\' 9"  (1.753 m)   Wt 180 lb (81.6 kg)   SpO2 97%   BMI 26.58 kg/m    Physical Exam Vitals signs reviewed.  Constitutional:      General: He is not in acute distress.    Appearance: Normal appearance. He is well-developed. He is not diaphoretic.  HENT:     Head: Normocephalic and atraumatic.     Right Ear: Tympanic membrane, ear canal and external ear normal.     Left Ear:  Tympanic membrane, ear canal and external ear normal.     Nose: Nose normal.     Mouth/Throat:     Mouth: Mucous membranes are moist.     Pharynx: Oropharynx is clear. No oropharyngeal exudate.  Eyes:     General: No scleral icterus.    Conjunctiva/sclera: Conjunctivae normal.     Pupils: Pupils are equal, round, and reactive to light.  Neck:     Musculoskeletal: Neck supple.     Thyroid: No thyromegaly.  Cardiovascular:     Rate and Rhythm: Normal rate and regular rhythm.     Heart sounds: Normal heart sounds. No murmur.  Pulmonary:     Effort: Pulmonary effort is normal. No respiratory distress.     Breath sounds: Normal breath sounds. No wheezing or rales.  Abdominal:     General: There is no distension.     Palpations: Abdomen is soft.     Tenderness: There is no abdominal tenderness.  Musculoskeletal:        General: No deformity.     Right lower leg: No edema.     Left lower leg: No edema.  Lymphadenopathy:     Cervical: No cervical adenopathy.  Skin:    General: Skin is warm and dry.     Capillary Refill: Capillary refill takes less than 2 seconds.     Findings: No rash.  Neurological:     Mental Status: He is alert and oriented to person, place, and time.  Psychiatric:        Mood and Affect: Mood normal.        Behavior: Behavior normal.        Thought Content: Thought content normal.      Depression Screen PHQ 2/9 Scores 04/03/2019 10/24/2017 04/25/2017 12/10/2015  PHQ - 2 Score 0 0 0 0  PHQ- 9 Score 0 - - -      Assessment & Plan:     Routine Health Maintenance and Physical Exam  Exercise Activities and Dietary recommendations Goals   None     Immunization History  Administered Date(s) Administered  . Tdap 12/10/2015    Health Maintenance  Topic Date Due  . INFLUENZA VACCINE  03/17/2019  . COLONOSCOPY  03/06/2022  . TETANUS/TDAP  12/09/2025  . HIV Screening  Completed     Discussed health benefits of physical activity, and encouraged  him to engage in regular exercise appropriate for his age and condition.    --------------------------------------------------------------------  Problem List Items Addressed This Visit      Cardiovascular and Mediastinum   Essential (primary) hypertension    Well  controlled Continue current medications Recheck metabolic panel F/u in 6 months       Relevant Orders   Comprehensive metabolic panel   Lipid panel     Genitourinary   ED (erectile dysfunction) of organic origin    Stable Continue viagra prn        Other   Overweight    Discussed importance of healthy weight management Discussed diet and exercise       Hypercholesteremia    Not on statin Recheck CMP and FLP Calculate ASCVD risk to determine need for statin for primary prevention      Relevant Orders   Comprehensive metabolic panel   Lipid panel    Other Visit Diagnoses    Encounter for annual physical exam    -  Primary   Relevant Orders   Comprehensive metabolic panel   Lipid panel       Return in about 6 months (around 10/04/2019) for chronic disease f/u.   The entirety of the information documented in the History of Present Illness, Review of Systems and Physical Exam were personally obtained by me. Portions of this information were initially documented by [facility], CMA and reviewed by me for thoroughness and accuracy.    Deontae Robson, Dionne Bucy, MD MPH [facility]

## 2019-04-03 NOTE — Patient Instructions (Signed)
Preventive Care 40-52 Years Old, Male Preventive care refers to lifestyle choices and visits with your health care provider that can promote health and wellness. This includes:  A yearly physical exam. This is also called an annual well check.  Regular dental and eye exams.  Immunizations.  Screening for certain conditions.  Healthy lifestyle choices, such as eating a healthy diet, getting regular exercise, not using drugs or products that contain nicotine and tobacco, and limiting alcohol use. What can I expect for my preventive care visit? Physical exam Your health care provider will check:  Height and weight. These may be used to calculate body mass index (BMI), which is a measurement that tells if you are at a healthy weight.  Heart rate and blood pressure.  Your skin for abnormal spots. Counseling Your health care provider may ask you questions about:  Alcohol, tobacco, and drug use.  Emotional well-being.  Home and relationship well-being.  Sexual activity.  Eating habits.  Work and work environment. What immunizations do I need?  Influenza (flu) vaccine  This is recommended every year. Tetanus, diphtheria, and pertussis (Tdap) vaccine  You may need a Td booster every 10 years. Varicella (chickenpox) vaccine  You may need this vaccine if you have not already been vaccinated. Zoster (shingles) vaccine  You may need this after age 60. Measles, mumps, and rubella (MMR) vaccine  You may need at least one dose of MMR if you were born in 1957 or later. You may also need a second dose. Pneumococcal conjugate (PCV13) vaccine  You may need this if you have certain conditions and were not previously vaccinated. Pneumococcal polysaccharide (PPSV23) vaccine  You may need one or two doses if you smoke cigarettes or if you have certain conditions. Meningococcal conjugate (MenACWY) vaccine  You may need this if you have certain conditions. Hepatitis A vaccine   You may need this if you have certain conditions or if you travel or work in places where you may be exposed to hepatitis A. Hepatitis B vaccine  You may need this if you have certain conditions or if you travel or work in places where you may be exposed to hepatitis B. Haemophilus influenzae type b (Hib) vaccine  You may need this if you have certain risk factors. Human papillomavirus (HPV) vaccine  If recommended by your health care provider, you may need three doses over 6 months. You may receive vaccines as individual doses or as more than one vaccine together in one shot (combination vaccines). Talk with your health care provider about the risks and benefits of combination vaccines. What tests do I need? Blood tests  Lipid and cholesterol levels. These may be checked every 5 years, or more frequently if you are over 50 years old.  Hepatitis C test.  Hepatitis B test. Screening  Lung cancer screening. You may have this screening every year starting at age 55 if you have a 30-pack-year history of smoking and currently smoke or have quit within the past 15 years.  Prostate cancer screening. Recommendations will vary depending on your family history and other risks.  Colorectal cancer screening. All adults should have this screening starting at age 50 and continuing until age 75. Your health care provider may recommend screening at age 45 if you are at increased risk. You will have tests every 1-10 years, depending on your results and the type of screening test.  Diabetes screening. This is done by checking your blood sugar (glucose) after you have not eaten   for a while (fasting). You may have this done every 1-3 years.  Sexually transmitted disease (STD) testing. Follow these instructions at home: Eating and drinking  Eat a diet that includes fresh fruits and vegetables, whole grains, lean protein, and low-fat dairy products.  Take vitamin and mineral supplements as recommended  by your health care provider.  Do not drink alcohol if your health care provider tells you not to drink.  If you drink alcohol: ? Limit how much you have to 0-2 drinks a day. ? Be aware of how much alcohol is in your drink. In the [place] one drink equals one 12 oz bottle of beer (355 mL), one 5 oz glass of wine (148 mL), or one 1 oz glass of hard liquor (44 mL). Lifestyle  Take daily care of your teeth and gums.  Stay active. Exercise for at least 30 minutes on 5 or more days each week.  Do not use any products that contain nicotine or tobacco, such as cigarettes, e-cigarettes, and chewing tobacco. If you need help quitting, ask your health care provider.  If you are sexually active, practice safe sex. Use a condom or other form of protection to prevent STIs (sexually transmitted infections).  Talk with your health care provider about taking a low-dose aspirin every day starting at age 33. What's next?  Go to your health care provider once a year for a well check visit.  Ask your health care provider how often you should have your eyes and teeth checked.  Stay up to date on all vaccines. This information is not intended to replace advice given to you by your health care provider. Make sure you discuss any questions you have with your health care provider. Document Released: 08/29/2015 Document Revised: 07/27/2018 Document Reviewed: 07/27/2018 Elsevier Patient Education  2020 [employer].

## 2019-04-03 NOTE — Assessment & Plan Note (Signed)
Stable Continue viagra prn

## 2019-04-03 NOTE — Assessment & Plan Note (Signed)
Discussed importance of healthy weight management Discussed diet and exercise  

## 2019-04-04 ENCOUNTER — Telehealth: Payer: Self-pay

## 2019-04-04 LAB — COMPREHENSIVE METABOLIC PANEL
ALT: 34 IU/L (ref 0–44)
AST: 26 IU/L (ref 0–40)
Albumin/Globulin Ratio: 1.8 (ref 1.2–2.2)
Albumin: 4.3 g/dL (ref 3.8–4.9)
Alkaline Phosphatase: 96 IU/L (ref 39–117)
BUN/Creatinine Ratio: 18 (ref 9–20)
BUN: 13 mg/dL (ref 6–24)
Bilirubin Total: 0.7 mg/dL (ref 0.0–1.2)
CO2: 23 mmol/L (ref 20–29)
Calcium: 9.8 mg/dL (ref 8.7–10.2)
Chloride: 101 mmol/L (ref 96–106)
Creatinine, Ser: 0.71 mg/dL — ABNORMAL LOW (ref 0.76–1.27)
GFR calc Af Amer: 125 mL/min/{1.73_m2} (ref >59)
GFR calc non Af Amer: 108 mL/min/{1.73_m2} (ref >59)
Globulin, Total: 2.4 g/dL (ref 1.5–4.5)
Glucose: 82 mg/dL (ref 65–99)
Potassium: 4 mmol/L (ref 3.5–5.2)
Sodium: 138 mmol/L (ref 134–144)
Total Protein: 6.7 g/dL (ref 6.0–8.5)

## 2019-04-04 LAB — LIPID PANEL
Chol/HDL Ratio: 4.3 ratio (ref 0.0–5.0)
Cholesterol, Total: 182 mg/dL (ref 100–199)
HDL: 42 mg/dL (ref >39)
LDL Calculated: 116 mg/dL — ABNORMAL HIGH (ref 0–99)
Triglycerides: 122 mg/dL (ref 0–149)
VLDL Cholesterol Cal: 24 mg/dL (ref 5–40)

## 2019-04-04 NOTE — Telephone Encounter (Signed)
Patient was advised. Patient want Dr. B to know that its from all his weight he lost and cutting back on his [place] food.

## 2019-04-04 NOTE — Telephone Encounter (Signed)
Patient was advised.  

## 2019-04-04 NOTE — Telephone Encounter (Signed)
-----   Message from Virginia Crews, MD sent at 04/04/2019 11:24 AM EDT ----- Normal labs. Cholesterol is improving.

## 2019-04-04 NOTE — Telephone Encounter (Signed)
-----   Message from Angela M Bacigalupo, MD sent at 04/04/2019 11:24 AM EDT ----- Normal labs. Cholesterol is improving. 

## 2019-04-04 NOTE — Telephone Encounter (Signed)
Noted  

## 2019-10-02 ENCOUNTER — Encounter: Payer: Self-pay | Admitting: Family Medicine

## 2019-10-02 ENCOUNTER — Other Ambulatory Visit: Payer: Self-pay

## 2019-10-02 ENCOUNTER — Ambulatory Visit: Payer: BC Managed Care – PPO | Admitting: Family Medicine

## 2019-10-02 VITALS — BP 150/92 | HR 87 | Temp 96.2°F | Wt 188.0 lb

## 2019-10-02 DIAGNOSIS — E663 Overweight: Secondary | ICD-10-CM | POA: Diagnosis not present

## 2019-10-02 DIAGNOSIS — I10 Essential (primary) hypertension: Secondary | ICD-10-CM | POA: Diagnosis not present

## 2019-10-02 MED ORDER — LISINOPRIL 20 MG PO TABS: 20.0000 mg | ORAL_TABLET | Freq: Every day | ORAL | 1 refills | Status: DC

## 2019-10-02 NOTE — Patient Instructions (Signed)
DASH Eating Plan DASH stands for "Dietary Approaches to Stop Hypertension." The DASH eating plan is a healthy eating plan that has been shown to reduce high blood pressure (hypertension). It may also reduce your risk for type 2 diabetes, heart disease, and stroke. The DASH eating plan may also help with weight loss. What are tips for following this plan?  General guidelines  Avoid eating more than 2,300 mg (milligrams) of salt (sodium) a day. If you have hypertension, you may need to reduce your sodium intake to 1,500 mg a day.  Limit alcohol intake to no more than 1 drink a day for nonpregnant women and 2 drinks a day for men. One drink equals 12 oz of beer, 5 oz of wine, or 1 oz of hard liquor.  Work with your health care provider to maintain a healthy body weight or to lose weight. Ask what an ideal weight is for you.  Get at least 30 minutes of exercise that causes your heart to beat faster (aerobic exercise) most days of the week. Activities may include walking, swimming, or biking.  Work with your health care provider or diet and nutrition specialist (dietitian) to adjust your eating plan to your individual calorie needs. Reading food labels   Check food labels for the amount of sodium per serving. Choose foods with less than 5 percent of the Daily Value of sodium. Generally, foods with less than 300 mg of sodium per serving fit into this eating plan.  To find whole grains, look for the word "whole" as the first word in the ingredient list. Shopping  Buy products labeled as "low-sodium" or "no salt added."  Buy fresh foods. Avoid canned foods and premade or frozen meals. Cooking  Avoid adding salt when cooking. Use salt-free seasonings or herbs instead of table salt or sea salt. Check with your health care provider or pharmacist before using salt substitutes.  Do not fry foods. Cook foods using healthy methods such as baking, boiling, grilling, and broiling instead.  Cook with  heart-healthy oils, such as olive, canola, soybean, or sunflower oil. Meal planning  Eat a balanced diet that includes: ? 5 or more servings of fruits and vegetables each day. At each meal, try to fill half of your plate with fruits and vegetables. ? Up to 6-8 servings of whole grains each day. ? Less than 6 oz of lean meat, poultry, or fish each day. A 3-oz serving of meat is about the same size as a deck of cards. One egg equals 1 oz. ? 2 servings of low-fat dairy each day. ? A serving of nuts, seeds, or beans 5 times each week. ? Heart-healthy fats. Healthy fats called Omega-3 fatty acids are found in foods such as flaxseeds and coldwater fish, like sardines, salmon, and mackerel.  Limit how much you eat of the following: ? Canned or prepackaged foods. ? Food that is high in trans fat, such as fried foods. ? Food that is high in saturated fat, such as fatty meat. ? Sweets, desserts, sugary drinks, and other foods with added sugar. ? Full-fat dairy products.  Do not salt foods before eating.  Try to eat at least 2 vegetarian meals each week.  Eat more home-cooked food and less restaurant, buffet, and fast food.  When eating at a restaurant, ask that your food be prepared with less salt or no salt, if possible. What foods are recommended? The items listed may not be a complete list. Talk with your dietitian about   what dietary choices are best for you. Grains Whole-grain or whole-wheat bread. Whole-grain or whole-wheat pasta. Brown rice. Oatmeal. Quinoa. Bulgur. Whole-grain and low-sodium cereals. Pita bread. Low-fat, low-sodium crackers. Whole-wheat flour tortillas. Vegetables Fresh or frozen vegetables (raw, steamed, roasted, or grilled). Low-sodium or reduced-sodium tomato and vegetable juice. Low-sodium or reduced-sodium tomato sauce and tomato paste. Low-sodium or reduced-sodium canned vegetables. Fruits All fresh, dried, or frozen fruit. Canned fruit in natural juice (without  added sugar). Meat and other protein foods Skinless chicken or turkey. Ground chicken or turkey. Pork with fat trimmed off. Fish and seafood. Egg whites. Dried beans, peas, or lentils. Unsalted nuts, nut butters, and seeds. Unsalted canned beans. Lean cuts of beef with fat trimmed off. Low-sodium, lean deli meat. Dairy Low-fat (1%) or fat-free (skim) milk. Fat-free, low-fat, or reduced-fat cheeses. Nonfat, low-sodium ricotta or cottage cheese. Low-fat or nonfat yogurt. Low-fat, low-sodium cheese. Fats and oils Soft margarine without trans fats. Vegetable oil. Low-fat, reduced-fat, or light mayonnaise and salad dressings (reduced-sodium). Canola, safflower, olive, soybean, and sunflower oils. Avocado. Seasoning and other foods Herbs. Spices. Seasoning mixes without salt. Unsalted popcorn and pretzels. Fat-free sweets. What foods are not recommended? The items listed may not be a complete list. Talk with your dietitian about what dietary choices are best for you. Grains Baked goods made with fat, such as croissants, muffins, or some breads. Dry pasta or rice meal packs. Vegetables Creamed or fried vegetables. Vegetables in a cheese sauce. Regular canned vegetables (not low-sodium or reduced-sodium). Regular canned tomato sauce and paste (not low-sodium or reduced-sodium). Regular tomato and vegetable juice (not low-sodium or reduced-sodium). Pickles. Olives. Fruits Canned fruit in a light or heavy syrup. Fried fruit. Fruit in cream or butter sauce. Meat and other protein foods Fatty cuts of meat. Ribs. Fried meat. Bacon. Sausage. Bologna and other processed lunch meats. Salami. Fatback. Hotdogs. Bratwurst. Salted nuts and seeds. Canned beans with added salt. Canned or smoked fish. Whole eggs or egg yolks. Chicken or turkey with skin. Dairy Whole or 2% milk, cream, and half-and-half. Whole or full-fat cream cheese. Whole-fat or sweetened yogurt. Full-fat cheese. Nondairy creamers. Whipped toppings.  Processed cheese and cheese spreads. Fats and oils Butter. Stick margarine. Lard. Shortening. Ghee. Bacon fat. Tropical oils, such as coconut, palm kernel, or palm oil. Seasoning and other foods Salted popcorn and pretzels. Onion salt, garlic salt, seasoned salt, table salt, and sea salt. Worcestershire sauce. Tartar sauce. Barbecue sauce. Teriyaki sauce. Soy sauce, including reduced-sodium. Steak sauce. Canned and packaged gravies. Fish sauce. Oyster sauce. Cocktail sauce. Horseradish that you find on the shelf. Ketchup. Mustard. Meat flavorings and tenderizers. Bouillon cubes. Hot sauce and Tabasco sauce. Premade or packaged marinades. Premade or packaged taco seasonings. Relishes. Regular salad dressings. Where to find more information:  National Heart, Lung, and Blood Institute: www.nhlbi.nih.gov  American Heart Association: www.heart.org Summary  The DASH eating plan is a healthy eating plan that has been shown to reduce high blood pressure (hypertension). It may also reduce your risk for type 2 diabetes, heart disease, and stroke.  With the DASH eating plan, you should limit salt (sodium) intake to 2,300 mg a day. If you have hypertension, you may need to reduce your sodium intake to 1,500 mg a day.  When on the DASH eating plan, aim to eat more fresh fruits and vegetables, whole grains, lean proteins, low-fat dairy, and heart-healthy fats.  Work with your health care provider or diet and nutrition specialist (dietitian) to adjust your eating plan to your   individual calorie needs. This information is not intended to replace advice given to you by your health care provider. Make sure you discuss any questions you have with your health care provider. Document Revised: 07/15/2017 Document Reviewed: 07/26/2016 Elsevier Patient Education  2020 Elsevier Inc.  

## 2019-10-02 NOTE — Assessment & Plan Note (Signed)
Uncontrolled Discussed importance of good blood pressure control Continue lisinopril HCTZ 20-25 mg daily and add additional lisinopril 20 mg tablet daily We will plan to switch his combo pill to the 20-12.5 mg pill and take 2 of them daily when he runs out of what he currently has Recheck metabolic panel Discussed importance of DASH diet and regular exercise Follow-up in 1 month

## 2019-10-02 NOTE — Progress Notes (Signed)
Patient: Ryan Ayala. Male    DOB: [birth year]   54 y.o.   MRN: [id number] Visit Date: 10/02/2019  Today's Provider: Lavon Paganini, MD   Chief Complaint  Patient presents with  . Hypertension   Subjective:     HPI   Hypertension, follow-up:  BP Readings from Last 3 Encounters:  04/03/19 128/82  06/19/18 138/82  10/24/17 136/84    He was last seen for hypertension 6 months ago.  BP at that visit was 128/82. Management since that visit includes no changes. He reports good compliance with treatment. He is not having side effects.  He is not exercising. He is not adherent to low salt diet.   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 He is experiencing none.  Patient denies chest pain, dyspnea, irregular heart beat and lower extremity edema.   Cardiovascular risk factors include hypertension.  Use of agents associated with hypertension: none.     Weight trend: increasing steadily Wt Readings from Last 3 Encounters:  04/03/19 180 lb (81.6 kg)  06/19/18 187 lb 9.6 oz (85.1 kg)  10/24/17 187 lb (84.8 kg)    Current diet: well balanced  ------------------------------------------------------------------------  No Known Allergies   Current Outpatient Medications:  .  lisinopril-hydrochlorothiazide (ZESTORETIC) 20-25 MG tablet, TAKE 1 TABLET BY MOUTH DAILY, Disp: 90 tablet, Rfl: 3 .  sildenafil (VIAGRA) 50 MG tablet, Take 50 mg by mouth daily as needed., Disp: , Rfl:   Review of Systems  Constitutional: Negative.   Respiratory: Negative.   Cardiovascular: Negative.   Gastrointestinal: Negative.   Neurological: Negative.   Psychiatric/Behavioral: Negative.     Social History   Tobacco Use  . Smoking status: Never Smoker  . Smokeless tobacco: Never Used  Substance Use Topics  . Alcohol use: Yes    Comment: rarely      Objective:   There were no vitals taken for this visit. There were no vitals filed for this  visit.There is no height or weight on file to calculate BMI.   Physical Exam Vitals reviewed.  Constitutional:      General: He is not in acute distress.    Appearance: Normal appearance. He is not diaphoretic.  HENT:     Head: Normocephalic and atraumatic.  Eyes:     General: No scleral icterus.    Conjunctiva/sclera: Conjunctivae normal.  Cardiovascular:     Rate and Rhythm: Normal rate and regular rhythm.     Pulses: Normal pulses.     Heart sounds: Normal heart sounds. No murmur.  Pulmonary:     Effort: Pulmonary effort is normal. No respiratory distress.     Breath sounds: Normal breath sounds. No wheezing or rhonchi.  Abdominal:     General: There is no distension.     Palpations: Abdomen is soft.     Tenderness: There is no abdominal tenderness.  Musculoskeletal:     Cervical back: Neck supple.     Right lower leg: No edema.     Left lower leg: No edema.  Lymphadenopathy:     Cervical: No cervical adenopathy.  Skin:    General: Skin is warm and dry.     Capillary Refill: Capillary refill takes less than 2 seconds.     Findings: No rash.  Neurological:     Mental Status: He is alert and oriented to person, place, and time. Mental status is at baseline.  Psychiatric:  Mood and Affect: Mood normal.        Behavior: Behavior normal.      No results found for any visits on 10/02/19.     Assessment & Plan    Problem List Items Addressed This Visit      Cardiovascular and Mediastinum   Essential (primary) hypertension    Uncontrolled Discussed importance of good blood pressure control Continue lisinopril HCTZ 20-25 mg daily and add additional lisinopril 20 mg tablet daily We will plan to switch his combo pill to the 20-12.5 mg pill and take 2 of them daily when he runs out of what he currently has Recheck metabolic panel Discussed importance of DASH diet and regular exercise Follow-up in 1 month      Relevant Medications   lisinopril (ZESTRIL) 20 MG  tablet   Other Relevant Orders   Basic Metabolic Panel (BMET)     Other   Overweight - Primary    Discussed importance of healthy weight management Discussed diet and exercise           Return in about 4 weeks (around 10/30/2019) for BP f/u.   The entirety of the information documented in the History of Present Illness, Review of Systems and Physical Exam were personally obtained by me. Portions of this information were initially documented by Abigail Miyamoto, CMA and reviewed by me for thoroughness and accuracy.    Dillinger Aston, Marzella Schlein, MD MPH [facility]

## 2019-10-02 NOTE — Assessment & Plan Note (Signed)
Discussed importance of healthy weight management Discussed diet and exercise  

## 2019-10-03 ENCOUNTER — Telehealth: Payer: Self-pay

## 2019-10-03 LAB — BASIC METABOLIC PANEL
BUN/Creatinine Ratio: 21 — ABNORMAL HIGH (ref 9–20)
BUN: 13 mg/dL (ref 6–24)
CO2: 20 mmol/L (ref 20–29)
Calcium: 10.1 mg/dL (ref 8.7–10.2)
Chloride: 101 mmol/L (ref 96–106)
Creatinine, Ser: 0.63 mg/dL — ABNORMAL LOW (ref 0.76–1.27)
GFR calc Af Amer: 131 mL/min/{1.73_m2} (ref >59)
GFR calc non Af Amer: 113 mL/min/{1.73_m2} (ref >59)
Glucose: 84 mg/dL (ref 65–99)
Potassium: 4.2 mmol/L (ref 3.5–5.2)
Sodium: 136 mmol/L (ref 134–144)

## 2019-10-03 NOTE — Telephone Encounter (Signed)
-----   Message from Erasmo Downer, MD sent at 10/03/2019  1:01 PM EST ----- Normal labs

## 2019-10-03 NOTE — Telephone Encounter (Signed)
Patient advised as below.  

## 2019-10-24 ENCOUNTER — Other Ambulatory Visit: Payer: Self-pay

## 2019-10-24 ENCOUNTER — Ambulatory Visit: Payer: BC Managed Care – PPO | Admitting: Family Medicine

## 2019-10-24 ENCOUNTER — Encounter: Payer: Self-pay | Admitting: Family Medicine

## 2019-10-24 VITALS — BP 139/84 | HR 78 | Temp 97.9°F | Wt 189.0 lb

## 2019-10-24 DIAGNOSIS — I10 Essential (primary) hypertension: Secondary | ICD-10-CM

## 2019-10-24 DIAGNOSIS — E663 Overweight: Secondary | ICD-10-CM | POA: Diagnosis not present

## 2019-10-24 DIAGNOSIS — E7800 Pure hypercholesterolemia, unspecified: Secondary | ICD-10-CM

## 2019-10-24 MED ORDER — LISINOPRIL-HYDROCHLOROTHIAZIDE 20-12.5 MG PO TABS: 2.0000 | ORAL_TABLET | Freq: Every day | ORAL | 3 refills | Status: DC

## 2019-10-24 NOTE — Assessment & Plan Note (Signed)
Well controlled Continue current medications Recheck metabolic panel F/u in six months  

## 2019-10-24 NOTE — Assessment & Plan Note (Signed)
Not on a statin Recheck CMP and FLP Calculate ASCVD risk to determine need for statin for primary prevention

## 2019-10-24 NOTE — Progress Notes (Signed)
Patient: Ryan Ayala. Male    DOB: May 08, 1966   54 y.o.   MRN: [id number] Visit Date: 10/24/2019  Today's Provider: Shirlee Latch, MD   Chief Complaint  Patient presents with  . Hypertension   Subjective:     HPI   Hypertension, follow-up:  BP Readings from Last 3 Encounters:  10/24/19 139/84  10/02/19 (!) 150/92  04/03/19 128/82    He was last seen for hypertension 4 weeks ago.  BP at that visit was 150/92. Management since that visit includes added Lisinopril 20mg  to lisinopril 20/25mg . He reports excellent compliance with treatment. He is not having side effects.  He is exercising. He is not adherent to low salt diet.   Outside blood pressures are. He is experiencing none.  Patient denies chest pain, chest pressure/discomfort, dyspnea, fatigue, irregular heart beat and lower extremity edema.   Cardiovascular risk factors include hypertension and male gender.  Use of agents associated with hypertension: none.     Weight trend: stable Wt Readings from Last 3 Encounters:  10/24/19 189 lb (85.7 kg)  10/02/19 188 lb (85.3 kg)  04/03/19 180 lb (81.6 kg)    Current diet: in general, a "healthy" diet    ------------------------------------------------------------------------    No Known Allergies   Current Outpatient Medications:  .  lisinopril (ZESTRIL) 20 MG tablet, Take 1 tablet (20 mg total) by mouth daily., Disp: 30 tablet, Rfl: 1 .  lisinopril-hydrochlorothiazide (ZESTORETIC) 20-25 MG tablet, TAKE 1 TABLET BY MOUTH DAILY, Disp: 90 tablet, Rfl: 3 .  sildenafil (VIAGRA) 50 MG tablet, Take 50 mg by mouth daily as needed., Disp: , Rfl:   Review of Systems  Constitutional: Negative.   Respiratory: Negative.   Cardiovascular: Negative.   Gastrointestinal: Negative.   Neurological: Negative for dizziness, light-headedness and headaches.    Social History   Tobacco Use  . Smoking status: Never Smoker  . Smokeless tobacco: Never Used    Substance Use Topics  . Alcohol use: Yes    Comment: rarely      Objective:   BP 139/84 (BP Location: Left Arm, Patient Position: Sitting, Cuff Size: Large)   Pulse 78   Temp 97.9 F (36.6 C) (Temporal)   Wt 189 lb (85.7 kg)   BMI 27.91 kg/m  Vitals:   10/24/19 0941  BP: 139/84  Pulse: 78  Temp: 97.9 F (36.6 C)  TempSrc: Temporal  Weight: 189 lb (85.7 kg)  Body mass index is 27.91 kg/m.   Physical Exam Vitals reviewed.  Constitutional:      General: He is not in acute distress.    Appearance: Normal appearance. He is not diaphoretic.  HENT:     Head: Normocephalic and atraumatic.  Eyes:     General: No scleral icterus.    Conjunctiva/sclera: Conjunctivae normal.  Cardiovascular:     Rate and Rhythm: Normal rate and regular rhythm.     Pulses: Normal pulses.     Heart sounds: Normal heart sounds. No murmur.  Pulmonary:     Effort: Pulmonary effort is normal. No respiratory distress.     Breath sounds: Normal breath sounds. No wheezing or rhonchi.  Musculoskeletal:     Cervical back: Neck supple.     Right lower leg: No edema.     Left lower leg: No edema.  Lymphadenopathy:     Cervical: No cervical adenopathy.  Skin:    General: Skin is warm and dry.     Capillary Refill: Capillary  refill takes less than 2 seconds.     Findings: No rash.  Neurological:     Mental Status: He is alert and oriented to person, place, and time. Mental status is at baseline.     Cranial Nerves: No cranial nerve deficit.  Psychiatric:        Mood and Affect: Mood normal.        Behavior: Behavior normal.      No results found for any visits on 10/24/19.     Assessment & Plan    Problem List Items Addressed This Visit      Cardiovascular and Mediastinum   Essential (primary) hypertension - Primary    Well controlled Continue current medications Recheck metabolic panel F/u in six months       Relevant Medications   lisinopril-hydrochlorothiazide (ZESTORETIC)  20-12.5 MG tablet     Other   Overweight    Discussed importance of healthy weight management Discussed diet and exercise       Hypercholesteremia    Not on a statin Recheck CMP and FLP Calculate ASCVD risk to determine need for statin for primary prevention      Relevant Medications   lisinopril-hydrochlorothiazide (ZESTORETIC) 20-12.5 MG tablet   Other Relevant Orders   Lipid panel   Comprehensive metabolic panel       Return in about 5 months (around 03/25/2020) for CPE.   The entirety of the information documented in the History of Present Illness, Review of Systems and Physical Exam were personally obtained by me. Portions of this information were initially documented by Ashley Royalty, CMA and reviewed by me for thoroughness and accuracy.    Nathanael Krist, Dionne Bucy, MD MPH [facility]

## 2019-10-24 NOTE — Assessment & Plan Note (Signed)
Discussed importance of healthy weight management Discussed diet and exercise  

## 2019-10-24 NOTE — Patient Instructions (Signed)

## 2019-10-25 ENCOUNTER — Telehealth: Payer: Self-pay

## 2019-10-25 LAB — COMPREHENSIVE METABOLIC PANEL
ALT: 48 IU/L — ABNORMAL HIGH (ref 0–44)
AST: 27 IU/L (ref 0–40)
Albumin/Globulin Ratio: 1.8 (ref 1.2–2.2)
Albumin: 4.6 g/dL (ref 3.8–4.9)
Alkaline Phosphatase: 126 IU/L — ABNORMAL HIGH (ref 39–117)
BUN/Creatinine Ratio: 20 (ref 9–20)
BUN: 13 mg/dL (ref 6–24)
Bilirubin Total: 0.4 mg/dL (ref 0.0–1.2)
CO2: 21 mmol/L (ref 20–29)
Calcium: 9.9 mg/dL (ref 8.7–10.2)
Chloride: 102 mmol/L (ref 96–106)
Creatinine, Ser: 0.65 mg/dL — ABNORMAL LOW (ref 0.76–1.27)
GFR calc Af Amer: 129 mL/min/{1.73_m2} (ref >59)
GFR calc non Af Amer: 112 mL/min/{1.73_m2} (ref >59)
Globulin, Total: 2.5 g/dL (ref 1.5–4.5)
Glucose: 86 mg/dL (ref 65–99)
Potassium: 4.4 mmol/L (ref 3.5–5.2)
Sodium: 139 mmol/L (ref 134–144)
Total Protein: 7.1 g/dL (ref 6.0–8.5)

## 2019-10-25 LAB — LIPID PANEL
Chol/HDL Ratio: 4.7 ratio (ref 0.0–5.0)
Cholesterol, Total: 225 mg/dL — ABNORMAL HIGH (ref 100–199)
HDL: 48 mg/dL (ref >39)
LDL Chol Calc (NIH): 149 mg/dL — ABNORMAL HIGH (ref 0–99)
Triglycerides: 157 mg/dL — ABNORMAL HIGH (ref 0–149)
VLDL Cholesterol Cal: 28 mg/dL (ref 5–40)

## 2019-10-25 NOTE — Telephone Encounter (Signed)
Attempted to contact pt, no answer, left VM to return the call.

## 2019-10-25 NOTE — Telephone Encounter (Signed)
-----   Message from Erasmo Downer, MD sent at 10/25/2019  9:55 AM EST ----- Cholesterol was getting better when last checked, but it is increased again today.  He can work on diet and exercise again to bring this down like he had previously, or we can consider statin.  Normal kidney function and electrolytes.  Slight increase in liver function.  Be sure to cut back on alcohol intake, stay well-hydrated, and not take any more than 1000 mg daily of Tylenol.  Recheck at next visit

## 2019-10-25 NOTE — Telephone Encounter (Signed)
Pt given lab results and recommendations per notes of Dr. Erven Colla on 10/25/19. Pt verbalized understanding. Pt states that he will work on diet and exercise as he did previously.

## 2019-10-25 NOTE — Telephone Encounter (Signed)
LMT[facility], [facility] may give results. 

## 2020-04-07 ENCOUNTER — Ambulatory Visit: Payer: BC Managed Care – PPO | Admitting: Family Medicine

## 2020-10-27 ENCOUNTER — Telehealth: Payer: Self-pay

## 2020-10-27 NOTE — Telephone Encounter (Signed)
[facility] faxed refill request for the following medications:   lisinopril-hydrochlorothiazide (ZESTORETIC) 20-12.5 MG tablet   Please advise.  

## 2020-10-28 MED ORDER — LISINOPRIL-HYDROCHLOROTHIAZIDE 20-12.5 MG PO TABS: 2.0000 | ORAL_TABLET | Freq: Every day | ORAL | 0 refills | Status: DC

## 2020-11-26 ENCOUNTER — Telehealth: Payer: Self-pay

## 2020-11-26 NOTE — Telephone Encounter (Signed)
Copied from CRM [id number]. Topic: Appointment Scheduling - Scheduling Inquiry for Clinic >> Nov 25, 2020  4:44 PM Ryan Ayala A wrote: Reason for CRM: Patient would like to be seen in office for a physical  Patient is unable to take off work often and will be available multiple days next week 4/18-/4/22  Agent was unable to successfully schedule a physical for patient at the time of call  Please contact to further advise scheduling when possible

## 2020-11-27 ENCOUNTER — Other Ambulatory Visit: Payer: Self-pay | Admitting: Family Medicine

## 2020-11-27 NOTE — Telephone Encounter (Signed)
Requested medications are due for refill today.  yes  Requested medications are on the active medications list.  yes  Last refill. 10/28/2020  Future visit scheduled.   no  Notes to clinic.  Courtesy refill already given.

## 2020-11-27 NOTE — Telephone Encounter (Signed)
Left message on VM stating Dr. Beryle Flock is out of the office and is booked for physicals until Nov. 2022.  If he needed medication refills, he could be seen by a provider in the office for that and then schedule a CPE with Dr. B in the fall.

## 2020-12-26 ENCOUNTER — Other Ambulatory Visit: Payer: Self-pay | Admitting: Family Medicine

## 2020-12-26 NOTE — Telephone Encounter (Signed)
Requested medications are due for refill today yes  Requested medications are on the active medication list yes  Last refill 4/14  Last visit 10/2019  Future visit scheduled no  Notes to clinic Has already had a curtesy refill and there is no upcoming appointment scheduled.

## 2021-01-09 ENCOUNTER — Other Ambulatory Visit: Payer: Self-pay | Admitting: Family Medicine

## 2021-01-09 NOTE — Telephone Encounter (Signed)
Requested medications are due for refill today yes  Requested medications are on the active medication list yes  Last refill 5/13  Last visit 10/2019  Future visit scheduled no  Notes to clinic Has already had a curtesy refill and there is no upcoming appointment scheduled.

## 2021-01-23 ENCOUNTER — Other Ambulatory Visit: Payer: Self-pay | Admitting: Family Medicine

## 2021-01-28 ENCOUNTER — Telehealth: Payer: Self-pay | Admitting: Family Medicine

## 2021-01-28 NOTE — Telephone Encounter (Signed)
  Notes to clinic: Patient has been schedule for 03/30/2021 Review for enough medication until that time    Requested Prescriptions  Pending Prescriptions Disp Refills   lisinopril-hydrochlorothiazide (ZESTORETIC) 20-12.5 MG tablet [Pharmacy Med Name: LISINOPRIL-HCTZ 20/12.5MG  TABLETS] 30 tablet 0    Sig: TAKE 2 TABLETS BY MOUTH DAILY      Cardiovascular:  ACEI + Diuretic Combos Failed - 01/28/2021 10:48 AM      Failed - Na in normal range and within 180 days    Sodium  Date Value Ref Range Status  10/24/2019 139 134 - 144 mmol/L Final          Failed - K in normal range and within 180 days    Potassium  Date Value Ref Range Status  10/24/2019 4.4 3.5 - 5.2 mmol/L Final          Failed - Cr in normal range and within 180 days    Creat  Date Value Ref Range Status  04/25/2017 0.70 0.70 - 1.33 mg/dL Final    Comment:    For patients >11 years of age, the reference limit for Creatinine is approximately 13% higher for people identified as African-American. .    Creatinine, Ser  Date Value Ref Range Status  10/24/2019 0.65 (L) 0.76 - 1.27 mg/dL Final          Failed - Ca in normal range and within 180 days    Calcium  Date Value Ref Range Status  10/24/2019 9.9 8.7 - 10.2 mg/dL Final          Failed - Valid encounter within last 6 months    Recent Outpatient Visits           1 year ago Essential (primary) hypertension   [facility], Marzella Schlein, MD   1 year ago Overweight   [facility] [place], Marzella Schlein, MD   1 year ago Encounter for annual physical exam   [facility] [place], Marzella Schlein, MD   2 years ago Essential (primary) hypertension   [facility] [place], Marzella Schlein, MD   3 years ago Encounter for annual physical exam   [facility], Marzella Schlein, MD       Future Appointments             In 2 months [place], Marzella Schlein, MD [facility], [facility]              Passed - Patient is not pregnant      Passed - Last BP in normal range    BP Readings from Last 1 Encounters:  10/24/19 139/84

## 2021-02-03 MED ORDER — LISINOPRIL-HYDROCHLOROTHIAZIDE 20-12.5 MG PO TABS: 2.0000 | ORAL_TABLET | Freq: Every day | ORAL | 0 refills | Status: DC

## 2021-02-03 NOTE — Telephone Encounter (Signed)
Pt called saying he needs a couple of refills on the lisinopril before he comes in August for his FU.  [facility] in [place]  CB#  [phone]

## 2021-02-03 NOTE — Addendum Note (Signed)
Addended by: Kavin Leech E on: 02/03/2021 09:20 AM   Modules accepted: Orders

## 2021-03-05 ENCOUNTER — Other Ambulatory Visit: Payer: Self-pay | Admitting: Family Medicine

## 2021-03-05 NOTE — Telephone Encounter (Signed)
Requested medications are due for refill today.  yes  Requested medications are on the active medications list.  yes  Last refill. 02/03/2021  Future visit scheduled.   yes  Notes to clinic.  Pt is more than 6 months overdue for OV and has been given courtesy refill.

## 2021-03-30 ENCOUNTER — Other Ambulatory Visit: Payer: Self-pay

## 2021-03-30 ENCOUNTER — Encounter: Payer: Self-pay | Admitting: Family Medicine

## 2021-03-30 ENCOUNTER — Ambulatory Visit: Payer: BC Managed Care – PPO | Admitting: Family Medicine

## 2021-03-30 VITALS — BP 125/85 | HR 98 | Temp 98.3°F | Ht 69.0 in | Wt 185.6 lb

## 2021-03-30 DIAGNOSIS — I10 Essential (primary) hypertension: Secondary | ICD-10-CM | POA: Diagnosis not present

## 2021-03-30 DIAGNOSIS — Z0000 Encounter for general adult medical examination without abnormal findings: Secondary | ICD-10-CM | POA: Diagnosis not present

## 2021-03-30 DIAGNOSIS — Z1159 Encounter for screening for other viral diseases: Secondary | ICD-10-CM | POA: Diagnosis not present

## 2021-03-30 DIAGNOSIS — E7800 Pure hypercholesterolemia, unspecified: Secondary | ICD-10-CM | POA: Diagnosis not present

## 2021-03-30 DIAGNOSIS — E663 Overweight: Secondary | ICD-10-CM

## 2021-03-30 NOTE — Assessment & Plan Note (Signed)
Well controlled Continue current medications Recheck metabolic panel F/u in 6 months  

## 2021-03-30 NOTE — Assessment & Plan Note (Signed)
Reviewed last lipid panel Not currently on a statin Recheck FLP and CMP Discussed diet and exercise  

## 2021-03-30 NOTE — Patient Instructions (Signed)
   The CDC recommends two doses of Shingrix (the shingles vaccine) separated by 2 to 6 months for adults age 54 years and older. I recommend checking with your insurance plan regarding coverage for this vaccine.   

## 2021-03-30 NOTE — Progress Notes (Signed)
Complete physical exam   Patient: Ryan Ayala.   DOB: November 03, 1965   55 y.o. Male  MRN: [id number] Visit Date: @ENCDATE @  Today's healthcare provider: , MD   Chief Complaint  Patient presents with   Hypertension   Subjective    Ryan Ayala. is a 55 y.o. male who presents today for a complete physical exam.  He reports consuming a general diet. The patient has a physically strenuous job, but has no regular exercise apart from work.  He generally feels well. He reports sleeping well. He does not have additional problems to discuss today.   Hypertension Pertinent negatives include no headaches, neck pain or shortness of breath. He doesn't need any refills on his 12.5 mg zestorectic. His hypertension is stable and well managed. He is compliant with treatment. He denies having side effects.   Vaccines  He denies receiving his COVID vaccine and inquired about shingrix.   Past Medical History:  Diagnosis Date   Hypertension    Past Surgical History:  Procedure Laterality Date   VASECTOMY     Social History   Socioeconomic History   Marital status: Married    Spouse name: 57   Number of children: 3   Years of education: 12   Highest education level: High school graduate  Occupational History   Not on file  Tobacco Use   Smoking status: Never   Smokeless tobacco: Never  Vaping Use   Vaping Use: Never used  Substance and Sexual Activity   Alcohol use: Yes    Comment: rarely   Drug use: No   Sexual activity: Yes    Birth control/protection: Surgical  Other Topics Concern   Not on file  Social History Narrative   Not on file   Social Determinants of Health   Financial Resource Strain: Not on file  Food Insecurity: Not on file  Transportation Needs: Not on file  Physical Activity: Not on file  Stress: Not on file  Social Connections: Not on file  Intimate Partner Violence: Not on file   Family Status  Relation Name Status   Mother   Alive   Father  Deceased at age 46   Sister  Alive   PGF  Deceased   Neg Hx  (Not Specified)   Family History  Problem Relation Age of Onset   Emphysema Mother    Heart failure Father    Cancer Father        "on his spine"   Healthy Sister    Heart attack Paternal Grandfather 2   Colon cancer Neg Hx    Prostate cancer Neg Hx    No Known Allergies  Patient Care Team: 59, MD as PCP - General (Family Medicine)   Medications: Outpatient Medications Prior to Visit  Medication Sig   lisinopril-hydrochlorothiazide (ZESTORETIC) 20-12.5 MG tablet TAKE 2 TABLETS BY MOUTH DAILY   sildenafil (VIAGRA) 50 MG tablet Take 50 mg by mouth daily as needed. (Patient not taking: Reported on 03/30/2021)   No facility-administered medications prior to visit.    Review of Systems  Constitutional:  Negative for chills, fatigue and fever.  HENT:  Negative for ear pain, sinus pressure, sinus pain and sore throat.   Eyes:  Negative for pain and visual disturbance.  Respiratory:  Negative for cough, chest tightness, shortness of breath and wheezing.   Gastrointestinal:  Negative for abdominal pain, blood in stool, constipation, diarrhea, nausea and vomiting.  Genitourinary:  Negative for dysuria, flank pain, frequency and urgency.  Musculoskeletal:  Negative for back pain, myalgias and neck pain.  Neurological:  Negative for dizziness, seizures, syncope, weakness, light-headedness, numbness and headaches.   @VANISHINGLABLINKS @ Objective    BP 125/85 (BP Location: Right Arm, Patient Position: Sitting, Cuff Size: Large)   Pulse 98   Temp 98.3 F (36.8 C) (Oral)   Ht 5\' 9"  (1.753 m)   Wt 185 lb 9.6 oz (84.2 kg)   SpO2 98%   BMI 27.41 kg/m    Physical Exam Vitals reviewed.  Constitutional:      General: He is not in acute distress.    Appearance: Normal appearance. He is well-developed. He is not diaphoretic.  HENT:     Head: Normocephalic and atraumatic.     Right Ear:  Tympanic membrane, ear canal and external ear normal.     Left Ear: Tympanic membrane, ear canal and external ear normal.     Nose: Nose normal.     Mouth/Throat:     Mouth: Mucous membranes are moist.     Pharynx: Oropharynx is clear. No oropharyngeal exudate.  Eyes:     General: No scleral icterus.    Conjunctiva/sclera: Conjunctivae normal.     Pupils: Pupils are equal, round, and reactive to light.  Neck:     Thyroid: No thyromegaly.  Cardiovascular:     Rate and Rhythm: Normal rate and regular rhythm.     Pulses: Normal pulses.     Heart sounds: Normal heart sounds. No murmur heard. Pulmonary:     Effort: Pulmonary effort is normal. No respiratory distress.     Breath sounds: Normal breath sounds. No wheezing or rales.  Abdominal:     General: There is no distension.     Palpations: Abdomen is soft.     Tenderness: There is no abdominal tenderness.  Musculoskeletal:        General: No deformity.     Cervical back: Neck supple.     Right lower leg: No edema.     Left lower leg: No edema.  Lymphadenopathy:     Cervical: No cervical adenopathy.  Skin:    General: Skin is warm and dry.     Findings: No rash.  Neurological:     Mental Status: He is alert and oriented to person, place, and time. Mental status is at baseline.     Sensory: No sensory deficit.     Motor: No weakness.     Gait: Gait normal.  Psychiatric:        Mood and Affect: Mood normal.        Behavior: Behavior normal.        Thought Content: Thought content normal.     Last depression screening scores PHQ 2/9 Scores 03/30/2021 04/03/2019 10/24/2017  PHQ - 2 Score 0 0 0  PHQ- 9 Score 0 0 -   Last fall risk screening Fall Risk  03/30/2021  Falls in the past year? 0  Number falls in past yr: 0  Injury with Fall? 0  Risk for fall due to : No Fall Risks   Last Audit-C alcohol use screening Alcohol Use Disorder Test (AUDIT) 03/30/2021  1. How often do you have a drink containing alcohol? 1  2. How  many drinks containing alcohol do you have on a typical day when you are drinking? 0  3. How often do you have six or more drinks on one occasion? 0  AUDIT-C Score 1  A score of 3 or more in women, and 4 or more in men indicates increased risk for alcohol abuse, EXCEPT if all of the points are from question 1   No results found for any visits on 03/30/21.  Assessment & Plan     Problem List Items Addressed This Visit       Cardiovascular and Mediastinum   Essential (primary) hypertension    Well controlled Continue current medications Recheck metabolic panel F/u in 6 months       Relevant Orders   Comprehensive metabolic panel     Other   Overweight    Discussed importance of healthy weight management Discussed diet and exercise       Hypercholesteremia    Reviewed last lipid panel Not currently on a statin Recheck FLP and CMP Discussed diet and exercise       Relevant Orders   Lipid panel   Other Visit Diagnoses     Encounter for annual physical exam    -  Primary   Relevant Orders   Hepatitis C Antibody   Need for hepatitis C screening test       Relevant Orders   Hepatitis C Antibody      Routine Health Maintenance and Physical Exam  Exercise Activities and Dietary recommendations  Goals   None     Immunization History  Administered Date(s) Administered   Tdap 12/10/2015    Health Maintenance  Topic Date Due   Hepatitis C Screening  Never done   Zoster Vaccines- Shingrix (1 of 2) Never done   INFLUENZA VACCINE  11/13/2021 (Originally 03/16/2021)   COLONOSCOPY (Pts 45-10yrs Insurance coverage will need to be confirmed)  03/06/2022   TETANUS/TDAP  12/09/2025   HIV Screening  Completed   Pneumococcal Vaccine 51-84 Years old  Aged Out   HPV VACCINES  Aged Out    Discussed health benefits of physical activity, and encouraged him to engage in regular exercise appropriate for his age and condition.   Return in about 6 months (around 09/30/2021)  for chronic disease f/u.     I,Essence Turner,acting as a Neurosurgeon for Shirlee Latch, MD.,have documented all relevant documentation on the behalf of Shirlee Latch, MD,as directed by  Shirlee Latch, MD while in the presence of Shirlee Latch, MD.   I, Shirlee Latch, MD, have reviewed all documentation for this visit. The documentation on 03/30/21 for the exam, diagnosis, procedures, and orders are all accurate and complete.   Jovon Winterhalter, Marzella Schlein, MD, MPH [facility]

## 2021-03-30 NOTE — Assessment & Plan Note (Signed)
Discussed importance of healthy weight management Discussed diet and exercise  

## 2021-03-31 ENCOUNTER — Other Ambulatory Visit: Payer: Self-pay

## 2021-03-31 DIAGNOSIS — E7800 Pure hypercholesterolemia, unspecified: Secondary | ICD-10-CM

## 2021-03-31 LAB — LIPID PANEL
Chol/HDL Ratio: 5.2 ratio — ABNORMAL HIGH (ref 0.0–5.0)
Cholesterol, Total: 220 mg/dL — ABNORMAL HIGH (ref 100–199)
HDL: 42 mg/dL (ref >39)
LDL Chol Calc (NIH): 149 mg/dL — ABNORMAL HIGH (ref 0–99)
Triglycerides: 159 mg/dL — ABNORMAL HIGH (ref 0–149)
VLDL Cholesterol Cal: 29 mg/dL (ref 5–40)

## 2021-03-31 LAB — COMPREHENSIVE METABOLIC PANEL
ALT: 44 IU/L (ref 0–44)
AST: 29 IU/L (ref 0–40)
Albumin/Globulin Ratio: 1.9 (ref 1.2–2.2)
Albumin: 4.7 g/dL (ref 3.8–4.9)
Alkaline Phosphatase: 108 IU/L (ref 44–121)
BUN/Creatinine Ratio: 20 (ref 9–20)
BUN: 13 mg/dL (ref 6–24)
Bilirubin Total: 0.7 mg/dL (ref 0.0–1.2)
CO2: 23 mmol/L (ref 20–29)
Calcium: 9.9 mg/dL (ref 8.7–10.2)
Chloride: 102 mmol/L (ref 96–106)
Creatinine, Ser: 0.65 mg/dL — ABNORMAL LOW (ref 0.76–1.27)
Globulin, Total: 2.5 g/dL (ref 1.5–4.5)
Glucose: 87 mg/dL (ref 65–99)
Potassium: 4.2 mmol/L (ref 3.5–5.2)
Sodium: 141 mmol/L (ref 134–144)
Total Protein: 7.2 g/dL (ref 6.0–8.5)
eGFR: 112 mL/min/{1.73_m2} (ref >59)

## 2021-03-31 LAB — HEPATITIS C ANTIBODY: Hep C Virus Ab: <0.1 s/co ratio (ref 0.0–0.9)

## 2021-03-31 MED ORDER — ROSUVASTATIN CALCIUM 5 MG PO TABS: 5.0000 mg | ORAL_TABLET | Freq: Every day | ORAL | 3 refills | Status: DC

## 2021-04-02 ENCOUNTER — Other Ambulatory Visit: Payer: Self-pay | Admitting: Family Medicine

## 2021-04-02 NOTE — Telephone Encounter (Signed)
Requested Prescriptions  Pending Prescriptions Disp Refills  . lisinopril-hydrochlorothiazide (ZESTORETIC) 20-12.5 MG tablet [Pharmacy Med Name: LISINOPRIL-HCTZ 20/12.5MG  TABLETS] 60 tablet 2    Sig: TAKE 2 TABLETS BY MOUTH DAILY     Cardiovascular:  ACEI + Diuretic Combos Failed - 04/02/2021  3:32 AM      Failed - Cr in normal range and within 180 days    Creat  Date Value Ref Range Status  04/25/2017 0.70 0.70 - 1.33 mg/dL Final    Comment:    For patients >19 years of age, the reference limit for Creatinine is approximately 13% higher for people identified as African-American. .    Creatinine, Ser  Date Value Ref Range Status  03/30/2021 0.65 (L) 0.76 - 1.27 mg/dL Final         Passed - Na in normal range and within 180 days    Sodium  Date Value Ref Range Status  03/30/2021 141 134 - 144 mmol/L Final         Passed - K in normal range and within 180 days    Potassium  Date Value Ref Range Status  03/30/2021 4.2 3.5 - 5.2 mmol/L Final         Passed - Ca in normal range and within 180 days    Calcium  Date Value Ref Range Status  03/30/2021 9.9 8.7 - 10.2 mg/dL Final         Passed - Patient is not pregnant      Passed - Last BP in normal range    BP Readings from Last 1 Encounters:  03/30/21 125/85         Passed - Valid encounter within last 6 months    Recent Outpatient Visits          3 days ago Encounter for annual physical exam   [facility], Marzella Schlein, MD   1 year ago Essential (primary) hypertension   [facility] Bacigalupo, Marzella Schlein, MD   1 year ago Overweight   [facility], Marzella Schlein, MD   2 years ago Encounter for annual physical exam   [facility], Marzella Schlein, MD   2 years ago Essential (primary) hypertension   [facility] Bacigalupo, Marzella Schlein, MD      Future Appointments            In 6 months Bacigalupo, Marzella Schlein, MD [facility]

## 2021-06-28 ENCOUNTER — Other Ambulatory Visit: Payer: Self-pay | Admitting: Family Medicine

## 2021-06-28 NOTE — Telephone Encounter (Signed)
Requested Prescriptions  Pending Prescriptions Disp Refills  . lisinopril-hydrochlorothiazide (ZESTORETIC) 20-12.5 MG tablet [Pharmacy Med Name: LISINOPRIL-HCTZ 20/12.5MG  TABLETS] 60 tablet 2    Sig: TAKE 2 TABLETS BY MOUTH DAILY     Cardiovascular:  ACEI + Diuretic Combos Failed - 06/28/2021  8:22 AM      Failed - Cr in normal range and within 180 days    Creat  Date Value Ref Range Status  04/25/2017 0.70 0.70 - 1.33 mg/dL Final    Comment:    For patients >58 years of age, the reference limit for Creatinine is approximately 13% higher for people identified as African-American. .    Creatinine, Ser  Date Value Ref Range Status  03/30/2021 0.65 (L) 0.76 - 1.27 mg/dL Final         Passed - Na in normal range and within 180 days    Sodium  Date Value Ref Range Status  03/30/2021 141 134 - 144 mmol/L Final         Passed - K in normal range and within 180 days    Potassium  Date Value Ref Range Status  03/30/2021 4.2 3.5 - 5.2 mmol/L Final         Passed - Ca in normal range and within 180 days    Calcium  Date Value Ref Range Status  03/30/2021 9.9 8.7 - 10.2 mg/dL Final         Passed - Patient is not pregnant      Passed - Last BP in normal range    BP Readings from Last 1 Encounters:  03/30/21 125/85         Passed - Valid encounter within last 6 months    Recent Outpatient Visits          3 months ago Encounter for annual physical exam   [facility], Marzella Schlein, MD   1 year ago Essential (primary) hypertension   [facility] Bacigalupo, Marzella Schlein, MD   1 year ago Overweight   [facility], Marzella Schlein, MD   2 years ago Encounter for annual physical exam   [facility], Marzella Schlein, MD   3 years ago Essential (primary) hypertension   [facility] Bacigalupo, Marzella Schlein, MD      Future Appointments            In 3 months Bacigalupo, Marzella Schlein, MD [facility]

## 2021-08-24 ENCOUNTER — Other Ambulatory Visit: Payer: Self-pay

## 2021-08-24 ENCOUNTER — Encounter: Payer: Self-pay | Admitting: Emergency Medicine

## 2021-08-24 ENCOUNTER — Ambulatory Visit
Admission: EM | Admit: 2021-08-24 | Discharge: 2021-08-24 | Disposition: A | Discharge status: Home / Self Care | Payer: BC Managed Care – PPO | Attending: Emergency Medicine | Admitting: Emergency Medicine

## 2021-08-24 DIAGNOSIS — L237 Allergic contact dermatitis due to plants, except food: Secondary | ICD-10-CM | POA: Diagnosis not present

## 2021-08-24 DIAGNOSIS — L03119 Cellulitis of unspecified part of limb: Secondary | ICD-10-CM

## 2021-08-24 MED ORDER — METHYLPREDNISOLONE SODIUM SUCC 125 MG IJ SOLR
80.0000 mg | Freq: Once | INTRAMUSCULAR | Status: AC
  Administered 2021-08-24: 80 mg via INTRAMUSCULAR

## 2021-08-24 MED ORDER — PREDNISONE 10 MG (21) PO TBPK: ORAL_TABLET | Freq: Every day | ORAL | 0 refills | Status: DC

## 2021-08-24 MED ORDER — CEPHALEXIN 500 MG PO CAPS: 500.0000 mg | ORAL_CAPSULE | Freq: Four times a day (QID) | ORAL | 0 refills | Status: DC

## 2021-08-24 NOTE — ED Provider Notes (Addendum)
Ryan Ayala    CSN: [id number] Arrival date & time: 08/24/21  6063      History   Chief Complaint Chief Complaint  Patient presents with   Poison Ivy    HPI Ryan Ayala. is a 56 y.o. male. Patient presents with pruritic rash x 9 days.  It started after coming in contact with poison oak while cutting trees.  The rash is getting worse; it is weeping and spreading.  Treatment at home with calamine lotion.  He denies fever or other symptoms.  His medical history includes hypertension.    The history is provided by the patient and medical records.   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 Authorizing Provider  cephALEXin (KEFLEX) 500 MG capsule Take 1 capsule (500 mg total) by mouth 4 (four) times daily. 08/24/21  Yes Mickie Bail, NP  lisinopril-hydrochlorothiazide (ZESTORETIC) 20-12.5 MG tablet TAKE 2 TABLETS BY MOUTH DAILY 06/28/21  Yes Bacigalupo, Marzella Schlein, MD  predniSONE (STERAPRED UNI-PAK 21 TAB) 10 MG (21) TBPK tablet Take by mouth daily. As directed 08/25/21  Yes Mickie Bail, NP  rosuvastatin (CRESTOR) 5 MG tablet Take 1 tablet (5 mg total) by mouth daily. 03/31/21  Yes Bacigalupo, Marzella Schlein, MD  sildenafil (VIAGRA) 50 MG tablet Take 50 mg by mouth daily as needed.   Yes [provider]    Family History Family History  Problem Relation Age of Onset   Emphysema Mother    Heart failure Father    Cancer Father        "on his spine"   Healthy Sister    Heart attack Paternal Grandfather 34   Colon cancer Neg Hx    Prostate cancer Neg Hx      Social History Social History   Tobacco Use   Smoking status: Never   Smokeless tobacco: Never  Vaping Use   Vaping Use: Never used  Substance Use Topics   Alcohol use: Yes    Comment: rarely   Drug use: No     Allergies   Patient has no known allergies.   Review of Systems Review of Systems  Constitutional:  Negative for chills and fever.  Musculoskeletal:  Negative for arthralgias and joint swelling.  Skin:  Positive for color change and rash.  Neurological:  Negative for weakness and numbness.  All other systems reviewed and are negative.   Physical Exam Triage Vital Signs ED Triage Vitals  Enc Vitals Group     BP      Pulse      Resp      Temp      Temp src      SpO2      Weight      Height      Head Circumference      Peak Flow      Pain Score      Pain Loc      Pain Edu?      Excl. in GC?    No data found.  Updated  Vital Signs BP 133/90 (BP Location: Left Arm)    Pulse (!) 103    Temp 97.7 F (36.5 C) (Oral)    Resp 18    SpO2 95%   Visual Acuity Right Eye Distance:   Left Eye Distance:   Bilateral Distance:    Right Eye Near:   Left Eye Near:    Bilateral Near:     Physical Exam Vitals and nursing note reviewed.  Constitutional:      General: He is not in acute distress.    Appearance: He is well-developed. He is not ill-appearing.  HENT:     Mouth/Throat:     Mouth: Mucous membranes are moist.  Cardiovascular:     Rate and Rhythm: Normal rate and regular rhythm.     Heart sounds: Normal heart sounds.  Pulmonary:     Effort: Pulmonary effort is normal. No respiratory distress.     Breath sounds: Normal breath sounds.  Musculoskeletal:     Cervical back: Neck supple.  Skin:    General: Skin is warm and dry.     Findings: Rash present.     Comments: Erythematous rash on LE; see picture for details.   Neurological:     Mental Status: He is alert.     Gait: Gait normal.  Psychiatric:        Mood and Affect: Mood normal.         Behavior: Behavior normal.      UC Treatments / Results  Labs (all labs ordered are listed, but only abnormal results are displayed) Labs Reviewed - No data to display  EKG   Radiology No results found.  Procedures Procedures (including critical care time)  Medications Ordered in UC Medications  methylPREDNISolone sodium succinate (SOLU-MEDROL) 125 mg/2 mL injection 80 mg (80 mg Intramuscular Given 08/24/21 0843)    Initial Impression / Assessment and Plan / UC Course  I have reviewed the triage vital signs and the nursing notes.  Pertinent labs & imaging results that were available during my care of the patient were reviewed by me and considered in my medical decision making (see chart for details).    Poison ivy dermatitis with cellulitis on lower extremities.  Solu-Medrol given here.  Instructed patient to start prednisone taper tomorrow.  Also treating with cephalexin and Benadryl.  Precautions for drowsiness with Benadryl discussed.  Instructed patient to follow-up with his PCP if his symptoms are not improving.  Education provided on poison oak dermatitis and cellulitis.  Patient agrees to plan of care.  Final Clinical Impressions(s) / UC Diagnoses   Final diagnoses:  Contact dermatitis due to poison oak  Cellulitis of lower extremity, unspecified laterality     Discharge Instructions      You were given an injection of a steroid called Solu-Medrol.  Start the prednisone taper tomorrow as directed.  Take the antibiotic and Benadryl as directed.    Follow up with your primary care provider if your symptoms are not improving.        ED Prescriptions     Medication Sig Dispense Auth. Provider   predniSONE (STERAPRED UNI-PAK 21 TAB) 10 MG (21) TBPK tablet Take by mouth daily. As directed 21 tablet Mickie Bailate, Stephani Janak H, NP   cephALEXin (KEFLEX) 500 MG capsule Take 1 capsule (500 mg total) by mouth 4 (four) times daily. 28 capsule Mickie Bailate, Haley Fuerstenberg H, NP      PDMP not  reviewed this encounter.   Mickie Bailate, Christia Coaxum H, NP 08/24/21 [phone]  Mickie Bail, NP 08/24/21 [phone]

## 2021-08-24 NOTE — Discharge Instructions (Addendum)
You were given an injection of a steroid called Solu-Medrol.  Start the prednisone taper tomorrow as directed.  Take the antibiotic and Benadryl as directed.    Follow up with your primary care provider if your symptoms are not improving.

## 2021-08-24 NOTE — ED Triage Notes (Signed)
Pt presents with poison oak bilateral legs x 1 week.

## 2021-10-01 ENCOUNTER — Other Ambulatory Visit: Payer: Self-pay | Admitting: Family Medicine

## 2021-10-01 NOTE — Telephone Encounter (Signed)
Requested medications are due for refill today.  yes  Requested medications are on the active medications list.  yes  Last refill. 06/28/2021 #60 2 refills  Future visit scheduled.   yes  Notes to clinic.  Failed refill protocol d/t expired labs.    Requested Prescriptions  Pending Prescriptions Disp Refills   lisinopril-hydrochlorothiazide (ZESTORETIC) 20-12.5 MG tablet [Pharmacy Med Name: LISINOPRIL-HCTZ 20/12.5MG TABLETS] 60 tablet 2    Sig: TAKE 2 TABLETS BY MOUTH DAILY     Cardiovascular:  ACEI + Diuretic Combos Failed - 10/01/2021  3:31 AM      Failed - Na in normal range and within 180 days    Sodium  Date Value Ref Range Status  03/30/2021 141 134 - 144 mmol/L Final          Failed - K in normal range and within 180 days    Potassium  Date Value Ref Range Status  03/30/2021 4.2 3.5 - 5.2 mmol/L Final          Failed - Cr in normal range and within 180 days    Creat  Date Value Ref Range Status  04/25/2017 0.70 0.70 - 1.33 mg/dL Final    Comment:    For patients >88 years of age, the reference limit for Creatinine is approximately 13% higher for people identified as African-American. .    Creatinine, Ser  Date Value Ref Range Status  03/30/2021 0.65 (L) 0.76 - 1.27 mg/dL Final          Failed - eGFR is 30 or above and within 180 days    GFR calc Af Amer  Date Value Ref Range Status  10/24/2019 129 >59 mL/min/1.73 Final   GFR calc non Af Amer  Date Value Ref Range Status  10/24/2019 112 >59 mL/min/1.73 Final   eGFR  Date Value Ref Range Status  03/30/2021 112 >59 mL/min/1.73 Final          Failed - Last BP in normal range    BP Readings from Last 1 Encounters:  08/24/21 133/90          Failed - Valid encounter within last 6 months    Recent Outpatient Visits           6 months ago Encounter for annual physical exam   [facility] [place], Dionne Bucy, MD   1 year ago Essential (primary) hypertension   [facility], Dionne Bucy, MD   2 years ago [facility], Dionne Bucy, MD   2 years ago Encounter for annual physical exam   [facility], Dionne Bucy, MD   3 years ago Essential (primary) hypertension   [place], Dionne Bucy, MD       Future Appointments             In 4 days Bacigalupo, Dionne Bucy, MD [facility] [place] - Patient is not pregnant

## 2021-10-05 ENCOUNTER — Ambulatory Visit: Payer: BC Managed Care – PPO | Admitting: Family Medicine

## 2021-11-25 NOTE — Progress Notes (Signed)
? ? ?I,Sulibeya S Dimas,acting as a scribe for Lavon Paganini, MD.,have documented all relevant documentation on the behalf of Lavon Paganini, MD,as directed by  Lavon Paganini, MD while in the presence of Lavon Paganini, MD. ?  ? ?Established patient visit ? ? ?Patient: Ryan Ayala.   DOB: [birth year]   56 y.o. Male  MRN: [id number] ?Visit Date: 11/26/2021 ? ?Today's healthcare provider: Lavon Paganini, MD  ? ?Chief Complaint  ?Patient presents with  ? Hypertension  ? Hyperlipidemia  ? ?Subjective  ?  ?HPI  ?Hypertension, follow-up ? ?BP Readings from Last 3 Encounters:  ?11/26/21 118/85  ?08/24/21 133/90  ?03/30/21 125/85  ? Wt Readings from Last 3 Encounters:  ?11/26/21 185 lb 9.6 oz (84.2 kg)  ?03/30/21 185 lb 9.6 oz (84.2 kg)  ?10/24/19 189 lb (85.7 kg)  ?  ? ?He was last seen for hypertension 6 months ago.  ?BP at that visit was 125/85. ?Management since that visit includes No changes. ?He reports excellent compliance with treatment. ?He is not having side effects.  ?Outside blood pressures are not being checked. ? ?He does not smoke. ? ?Use of agents associated with hypertension: none.  ? ?--------------------------------------------------------------------------------------------------- ?Lipid/Cholesterol, follow-up ? ?Last Lipid Panel: ?Lab Results  ?Component Value Date  ? CHOL 220 (H) 03/30/2021  ? LDLCALC 149 (H) 03/30/2021  ? HDL 42 03/30/2021  ? TRIG 159 (H) 03/30/2021  ? ? ?He was last seen for this 6 months ago.  ?Management since that visit includes no changes. ? ?He reports excellent compliance with treatment. ?He is not having side effects.  ? ?Symptoms: ?No appetite changes No foot ulcerations  ?No chest pain No chest pressure/discomfort  ?No dyspnea No orthopnea  ?No fatigue No lower extremity edema  ?No palpitations No paroxysmal nocturnal dyspnea  ?No nausea No numbness or tingling of extremity  ?No polydipsia No polyuria  ?No speech difficulty No syncope  ? ? ?Last metabolic  panel ?Lab Results  ?Component Value Date  ? GLUCOSE 87 03/30/2021  ? NA 141 03/30/2021  ? K 4.2 03/30/2021  ? BUN 13 03/30/2021  ? CREATININE 0.65 (L) 03/30/2021  ? EGFR 112 03/30/2021  ? GFRNONAA 112 10/24/2019  ? CALCIUM 9.9 03/30/2021  ? AST 29 03/30/2021  ? ALT 44 03/30/2021  ? ?The 10-year ASCVD risk score (Arnett DK, et al., 2019) is: 6.8% ? ?--------------------------------------------------------------------------------------------------- ? ? ?Medications: ?Outpatient Medications Prior to Visit  ?Medication Sig  ? lisinopril-hydrochlorothiazide (ZESTORETIC) 20-12.5 MG tablet TAKE 2 TABLETS BY MOUTH DAILY  ? rosuvastatin (CRESTOR) 5 MG tablet Take 1 tablet (5 mg total) by mouth daily.  ? sildenafil (VIAGRA) 50 MG tablet Take 50 mg by mouth daily as needed.  ? [DISCONTINUED] cephALEXin (KEFLEX) 500 MG capsule Take 1 capsule (500 mg total) by mouth 4 (four) times daily. (Patient not taking: Reported on 11/26/2021)  ? [DISCONTINUED] predniSONE (STERAPRED UNI-PAK 21 TAB) 10 MG (21) TBPK tablet Take by mouth daily. As directed (Patient not taking: Reported on 11/26/2021)  ? ?No facility-administered medications prior to visit.  ? ? ?Review of Systems  ?Constitutional:  Negative for appetite change and fatigue.  ?Respiratory:  Negative for chest tightness and shortness of breath.   ?Cardiovascular:  Negative for chest pain and palpitations.  ? ?  ?  Objective  ?  ?BP 118/85 (BP Location: Left Arm, Patient Position: Sitting, Cuff Size: Large)   Pulse 67   Temp 98.6 ?F (37 ?C) (Oral)   Resp 16   Wt 185  lb 9.6 oz (84.2 kg)   BMI 27.41 kg/m?  ?BP Readings from Last 3 Encounters:  ?11/26/21 118/85  ?08/24/21 133/90  ?03/30/21 125/85  ? ?Wt Readings from Last 3 Encounters:  ?11/26/21 185 lb 9.6 oz (84.2 kg)  ?03/30/21 185 lb 9.6 oz (84.2 kg)  ?10/24/19 189 lb (85.7 kg)  ? ?  ? ?Physical Exam ?Vitals reviewed.  ?Constitutional:   ?   General: He is not in acute distress. ?   Appearance: Normal appearance. He is not  diaphoretic.  ?HENT:  ?   Head: Normocephalic and atraumatic.  ?Eyes:  ?   General: No scleral icterus. ?   Conjunctiva/sclera: Conjunctivae normal.  ?Cardiovascular:  ?   Rate and Rhythm: Normal rate and regular rhythm.  ?   Pulses: Normal pulses.  ?   Heart sounds: Normal heart sounds. No murmur heard. ?Pulmonary:  ?   Effort: Pulmonary effort is normal. No respiratory distress.  ?   Breath sounds: Normal breath sounds. No wheezing or rhonchi.  ?Abdominal:  ?   General: There is no distension.  ?   Palpations: Abdomen is soft.  ?   Tenderness: There is no abdominal tenderness.  ?Musculoskeletal:  ?   Cervical back: Neck supple.  ?   Right lower leg: No edema.  ?   Left lower leg: No edema.  ?Lymphadenopathy:  ?   Cervical: No cervical adenopathy.  ?Skin: ?   General: Skin is warm and dry.  ?   Findings: No rash.  ?Neurological:  ?   Mental Status: He is alert and oriented to person, place, and time.  ?Psychiatric:     ?   Mood and Affect: Mood normal.     ?   Behavior: Behavior normal.  ?  ? ? ?No results found for any visits on 11/26/21. ? Assessment & Plan  ?  ? ?Problem List Items Addressed This Visit   ? ?  ? Cardiovascular and Mediastinum  ? Essential (primary) hypertension - Primary  ?  Well controlled ?Continue current medications ?Recheck metabolic panel ?F/u in 6 months  ?  ?  ? Relevant Orders  ? Comprehensive metabolic panel  ?  ? Other  ? Overweight  ?  Discussed importance of healthy weight management ?Discussed diet and exercise  ?  ?  ? Hypercholesteremia  ?  Previously elevated ?Continue statin ?Repeat FLP and CMP ?  ?  ? Relevant Orders  ? Comprehensive metabolic panel  ? Lipid panel  ?  ? ?Return in about 6 months (around 05/28/2022) for CPE.  ?   ? ?I, Lavon Paganini, MD, have reviewed all documentation for this visit. The documentation on 11/26/21 for the exam, diagnosis, procedures, and orders are all accurate and complete. ? ? ?Virginia Crews, MD, MPH ?[facility]   ?

## 2021-11-26 ENCOUNTER — Ambulatory Visit: Payer: BC Managed Care – PPO | Admitting: Family Medicine

## 2021-11-26 ENCOUNTER — Encounter: Payer: Self-pay | Admitting: Family Medicine

## 2021-11-26 VITALS — BP 118/85 | HR 67 | Temp 98.6°F | Resp 16 | Wt 185.6 lb

## 2021-11-26 DIAGNOSIS — E663 Overweight: Secondary | ICD-10-CM | POA: Diagnosis not present

## 2021-11-26 DIAGNOSIS — E7800 Pure hypercholesterolemia, unspecified: Secondary | ICD-10-CM

## 2021-11-26 DIAGNOSIS — I10 Essential (primary) hypertension: Secondary | ICD-10-CM

## 2021-11-26 NOTE — Assessment & Plan Note (Signed)
Previously elevated Continue statin Repeat FLP and CMP 

## 2021-11-26 NOTE — Assessment & Plan Note (Signed)
Well controlled Continue current medications Recheck metabolic panel F/u in 6 months  

## 2021-11-26 NOTE — Assessment & Plan Note (Signed)
Discussed importance of healthy weight management Discussed diet and exercise  

## 2021-11-27 LAB — COMPREHENSIVE METABOLIC PANEL
ALT: 54 IU/L — ABNORMAL HIGH (ref 0–44)
AST: 27 IU/L (ref 0–40)
Albumin/Globulin Ratio: 1.7 (ref 1.2–2.2)
Albumin: 4.5 g/dL (ref 3.8–4.9)
Alkaline Phosphatase: 120 IU/L (ref 44–121)
BUN/Creatinine Ratio: 15 (ref 9–20)
BUN: 11 mg/dL (ref 6–24)
Bilirubin Total: 0.9 mg/dL (ref 0.0–1.2)
CO2: 22 mmol/L (ref 20–29)
Calcium: 10 mg/dL (ref 8.7–10.2)
Chloride: 103 mmol/L (ref 96–106)
Creatinine, Ser: 0.74 mg/dL — ABNORMAL LOW (ref 0.76–1.27)
Globulin, Total: 2.6 g/dL (ref 1.5–4.5)
Glucose: 94 mg/dL (ref 70–99)
Potassium: 4.3 mmol/L (ref 3.5–5.2)
Sodium: 140 mmol/L (ref 134–144)
Total Protein: 7.1 g/dL (ref 6.0–8.5)
eGFR: 108 mL/min/{1.73_m2} (ref >59)

## 2021-11-27 LAB — LIPID PANEL
Chol/HDL Ratio: 4 ratio (ref 0.0–5.0)
Cholesterol, Total: 182 mg/dL (ref 100–199)
HDL: 46 mg/dL (ref >39)
LDL Chol Calc (NIH): 118 mg/dL — ABNORMAL HIGH (ref 0–99)
Triglycerides: 100 mg/dL (ref 0–149)
VLDL Cholesterol Cal: 18 mg/dL (ref 5–40)

## 2022-01-02 ENCOUNTER — Other Ambulatory Visit: Payer: Self-pay | Admitting: Family Medicine

## 2022-04-05 ENCOUNTER — Other Ambulatory Visit: Payer: Self-pay

## 2022-04-05 ENCOUNTER — Telehealth: Payer: Self-pay | Admitting: Family Medicine

## 2022-04-05 DIAGNOSIS — E7800 Pure hypercholesterolemia, unspecified: Secondary | ICD-10-CM

## 2022-04-05 MED ORDER — ROSUVASTATIN CALCIUM 5 MG PO TABS: 5.0000 mg | ORAL_TABLET | Freq: Every day | ORAL | 3 refills | Status: DC

## 2022-04-05 NOTE — Telephone Encounter (Signed)
[facility] faxed refill request for the following medications: ? ?rosuvastatin (CRESTOR) 5 MG tablet  ? ?Please advise. ? ?

## 2022-06-14 NOTE — Progress Notes (Unsigned)
I,Yessica Putnam S Karina Nofsinger,acting as a Education administrator for Ryan Paganini, MD.,have documented all relevant documentation on the behalf of Ryan Paganini, MD,as directed by  Ryan Paganini, MD while in the presence of Ryan Paganini, MD.    Complete physical exam   Patient: Ryan Ayala.   DOB: [birth year]   56 y.o. Male  MRN: [id number] Visit Date: 06/15/2022  Today's healthcare provider: Lavon Paganini, MD   No chief complaint on file.  Subjective    Ryan Teed. is a 56 y.o. male who presents today for a complete physical exam.  He reports consuming a {diet types:17450} diet. {Exercise:19826} He generally feels {well/fairly well/poorly:18703}. He reports sleeping {well/fairly well/poorly:18703}. He {does/does not:200015} have additional problems to discuss today.  HPI    Past Medical History:  Diagnosis Date   Hypertension    Past Surgical History:  Procedure Laterality Date   VASECTOMY     Social History   Socioeconomic History   Marital status: Married    Spouse name: Shirlean Mylar   Number of children: 3   Years of education: 12   Highest education level: High school graduate  Occupational History   Not on file  Tobacco Use   Smoking status: Never   Smokeless tobacco: Never  Vaping Use   Vaping Use: Never used  Substance and Sexual Activity   Alcohol use: Yes    Comment: rarely   Drug use: No   Sexual activity: Yes    Birth control/protection: Surgical  Other Topics Concern   Not on file  Social History Narrative   Not on file   Social Determinants of Health   Financial Resource Strain: Low Risk  (10/24/2017)   Overall Financial Resource Strain (CARDIA)    Difficulty of Paying Living Expenses: Not hard at all  Food Insecurity: No Food Insecurity (10/24/2017)   Hunger Vital Sign    Worried About Running Out of Food in the Last Year: Never true    Ran Out of Food in the Last Year: Never true  Transportation Needs: No Transportation Needs (10/24/2017)    PRAPARE - Hydrologist (Medical): No    Lack of Transportation (Non-Medical): No  Physical Activity: Not on file  Stress: Not on file  Social Connections: Not on file  Intimate Partner Violence: Not on file   Family Status  Relation Name Status   Mother  Alive   Father  Deceased at age 12   Sister  Alive   PGF  Deceased   Neg Hx  (Not Specified)   Family History  Problem Relation Age of Onset   Emphysema Mother    Heart failure Father    Cancer Father        "on his spine"   Healthy Sister    Heart attack Paternal Grandfather 24   Colon cancer Neg Hx    Prostate cancer Neg Hx    No Known Allergies  Patient Care Team: Virginia Crews, MD as PCP - General (Family Medicine)   Medications: Outpatient Medications Prior to Visit  Medication Sig   lisinopril-hydrochlorothiazide (ZESTORETIC) 20-12.5 MG tablet TAKE 2 TABLETS BY MOUTH DAILY   rosuvastatin (CRESTOR) 5 MG tablet Take 1 tablet (5 mg total) by mouth daily.   sildenafil (VIAGRA) 50 MG tablet Take 50 mg by mouth daily as needed.   No facility-administered medications prior to visit.    Review of Systems  Last CBC Lab Results  Component Value Date  WBC 9.3 12/11/2015   HGB 16.1 12/11/2015   HCT 47.2 12/11/2015   MCV 91 12/11/2015   MCH 31.0 12/11/2015   RDW 13.4 12/11/2015   PLT 368 02/58/5277   Last metabolic panel Lab Results  Component Value Date   GLUCOSE 94 11/26/2021   NA 140 11/26/2021   K 4.3 11/26/2021   CL 103 11/26/2021   CO2 22 11/26/2021   BUN 11 11/26/2021   CREATININE 0.74 (L) 11/26/2021   EGFR 108 11/26/2021   CALCIUM 10.0 11/26/2021   PROT 7.1 11/26/2021   ALBUMIN 4.5 11/26/2021   LABGLOB 2.6 11/26/2021   AGRATIO 1.7 11/26/2021   BILITOT 0.9 11/26/2021   ALKPHOS 120 11/26/2021   AST 27 11/26/2021   ALT 54 (H) 11/26/2021   Last lipids Lab Results  Component Value Date   CHOL 182 11/26/2021   HDL 46 11/26/2021   LDLCALC 118 (H) 11/26/2021    TRIG 100 11/26/2021   CHOLHDL 4.0 11/26/2021   Last hemoglobin A1c No results found for: "HGBA1C" Last thyroid functions Lab Results  Component Value Date   TSH 1.950 12/11/2015   Last vitamin D No results found for: "25OHVITD2", "25OHVITD3", "VD25OH" Last vitamin B12 and Folate No results found for: "VITAMINB12", "FOLATE"    Objective    There were no vitals taken for this visit. BP Readings from Last 3 Encounters:  11/26/21 118/85  08/24/21 133/90  03/30/21 125/85   Wt Readings from Last 3 Encounters:  11/26/21 185 lb 9.6 oz (84.2 kg)  03/30/21 185 lb 9.6 oz (84.2 kg)  10/24/19 189 lb (85.7 kg)       Physical Exam  ***  Last depression screening scores    11/26/2021    8:14 AM 03/30/2021    2:06 PM 04/03/2019   11:02 AM  PHQ 2/9 Scores  PHQ - 2 Score 0 0 0  PHQ- 9 Score 0 0 0   Last fall risk screening    11/26/2021    8:15 AM  [place] in the past year? 0  Number falls in past yr: 0  Injury with Fall? 0  Risk for fall due to : No Fall Risks  Follow up Falls evaluation completed   Last Audit-C alcohol use screening    11/26/2021    8:14 AM  Alcohol Use Disorder Test (AUDIT)  1. How often do you have a drink containing alcohol? 1  2. How many drinks containing alcohol do you have on a typical day when you are drinking? 0  3. How often do you have six or more drinks on one occasion? 0  AUDIT-C Score 1   A score of 3 or more in women, and 4 or more in men indicates increased risk for alcohol abuse, EXCEPT if all of the points are from question 1   No results found for any visits on 06/15/22.  Assessment & Plan    Routine Health Maintenance and Physical Exam  Exercise Activities and Dietary recommendations  Goals   None     Immunization History  Administered Date(s) Administered   Tdap 12/10/2015    Health Maintenance  Topic Date Due   Zoster Vaccines- Shingrix (1 of 2) Never done   COLONOSCOPY (Pts 45-72yr Insurance coverage  will need to be confirmed)  03/06/2022   INFLUENZA VACCINE  Never done   TETANUS/TDAP  12/09/2025   Hepatitis C Screening  Completed   HIV Screening  Completed   HPV VACCINES  Aged  Out    Discussed health benefits of physical activity, and encouraged him to engage in regular exercise appropriate for his age and condition.  ***  No follow-ups on file.     {provider attestation***:1}   Ryan Paganini, MD  [facility] [phone] (phone) [phone] (fax)  [place]

## 2022-06-15 ENCOUNTER — Ambulatory Visit (INDEPENDENT_AMBULATORY_CARE_PROVIDER_SITE_OTHER): Payer: BC Managed Care – PPO | Admitting: Family Medicine

## 2022-06-15 ENCOUNTER — Encounter: Payer: Self-pay | Admitting: Family Medicine

## 2022-06-15 VITALS — BP 121/87 | HR 83 | Temp 98.7°F | Resp 16 | Ht 69.0 in | Wt 183.7 lb

## 2022-06-15 DIAGNOSIS — E663 Overweight: Secondary | ICD-10-CM

## 2022-06-15 DIAGNOSIS — E7800 Pure hypercholesterolemia, unspecified: Secondary | ICD-10-CM | POA: Diagnosis not present

## 2022-06-15 DIAGNOSIS — Z1211 Encounter for screening for malignant neoplasm of colon: Secondary | ICD-10-CM

## 2022-06-15 DIAGNOSIS — Z0000 Encounter for general adult medical examination without abnormal findings: Secondary | ICD-10-CM

## 2022-06-15 DIAGNOSIS — I10 Essential (primary) hypertension: Secondary | ICD-10-CM

## 2022-06-15 MED ORDER — ROSUVASTATIN CALCIUM 5 MG PO TABS: 5.0000 mg | ORAL_TABLET | Freq: Every day | ORAL | 3 refills | Status: DC

## 2022-06-15 MED ORDER — LISINOPRIL-HYDROCHLOROTHIAZIDE 20-12.5 MG PO TABS: 2.0000 | ORAL_TABLET | Freq: Every day | ORAL | 3 refills | Status: DC

## 2022-06-15 NOTE — Assessment & Plan Note (Signed)
Well controlled Continue current medications Recheck metabolic panel F/u in 6 months  

## 2022-06-15 NOTE — Assessment & Plan Note (Signed)
Discussed importance of healthy weight management Discussed diet and exercise  

## 2022-06-15 NOTE — Assessment & Plan Note (Signed)
Previously well controlled Continue statin Repeat FLP and CMP  

## 2022-06-16 LAB — COMPREHENSIVE METABOLIC PANEL
ALT: 36 IU/L (ref 0–44)
AST: 26 IU/L (ref 0–40)
Albumin/Globulin Ratio: 1.4 (ref 1.2–2.2)
Albumin: 4.5 g/dL (ref 3.8–4.9)
Alkaline Phosphatase: 109 IU/L (ref 44–121)
BUN/Creatinine Ratio: 19 (ref 9–20)
BUN: 14 mg/dL (ref 6–24)
Bilirubin Total: 0.9 mg/dL (ref 0.0–1.2)
CO2: 21 mmol/L (ref 20–29)
Calcium: 9.8 mg/dL (ref 8.7–10.2)
Chloride: 100 mmol/L (ref 96–106)
Creatinine, Ser: 0.73 mg/dL — ABNORMAL LOW (ref 0.76–1.27)
Globulin, Total: 3.2 g/dL (ref 1.5–4.5)
Glucose: 92 mg/dL (ref 70–99)
Potassium: 4.2 mmol/L (ref 3.5–5.2)
Sodium: 140 mmol/L (ref 134–144)
Total Protein: 7.7 g/dL (ref 6.0–8.5)
eGFR: 107 mL/min/{1.73_m2} (ref >59)

## 2022-06-16 LAB — LIPID PANEL
Chol/HDL Ratio: 4 ratio (ref 0.0–5.0)
Cholesterol, Total: 168 mg/dL (ref 100–199)
HDL: 42 mg/dL (ref >39)
LDL Chol Calc (NIH): 107 mg/dL — ABNORMAL HIGH (ref 0–99)
Triglycerides: 105 mg/dL (ref 0–149)
VLDL Cholesterol Cal: 19 mg/dL (ref 5–40)

## 2022-06-22 ENCOUNTER — Encounter: Payer: BC Managed Care – PPO | Admitting: Family Medicine

## 2022-11-29 ENCOUNTER — Telehealth: Payer: Self-pay | Admitting: Family Medicine

## 2022-12-01 ENCOUNTER — Telehealth: Payer: Self-pay | Admitting: *Deleted

## 2022-12-01 ENCOUNTER — Other Ambulatory Visit: Payer: Self-pay | Admitting: *Deleted

## 2022-12-01 ENCOUNTER — Telehealth: Payer: Self-pay

## 2022-12-01 DIAGNOSIS — Z1211 Encounter for screening for malignant neoplasm of colon: Secondary | ICD-10-CM

## 2022-12-01 NOTE — Telephone Encounter (Signed)
Gastroenterology Pre-Procedure Review  Request Date: 05/02/2023 Requesting Physician: Dr. Servando Snare  PATIENT REVIEW QUESTIONS: The patient responded to the following health history questions as indicated:    1. Are you having any GI issues? no 2. Do you have a personal history of Polyps? no 3. Do you have a family history of Colon Cancer or Polyps? no 4. Diabetes Mellitus? no 5. Joint replacements in the past 12 months?no 6. Major health problems in the past 3 months?no 7. Any artificial heart valves, MVP, or defibrillator?no    MEDICATIONS & ALLERGIES:    Patient reports the following regarding taking any anticoagulation/antiplatelet therapy:   Plavix, Coumadin, Eliquis, Xarelto, Lovenox, Pradaxa, Brilinta, or Effient? no Aspirin? no  Patient confirms/reports the following medications:  Current Outpatient Medications  Medication Sig Dispense Refill   lisinopril-hydrochlorothiazide (ZESTORETIC) 20-12.5 MG tablet Take 2 tablets by mouth daily. 180 tablet 3   rosuvastatin (CRESTOR) 5 MG tablet Take 1 tablet (5 mg total) by mouth daily. 90 tablet 3   sildenafil (VIAGRA) 50 MG tablet Take 50 mg by mouth daily as needed.     No current facility-administered medications for this visit.    Patient confirms/reports the following allergies:  No Known Allergies  No orders of the defined types were placed in this encounter.   AUTHORIZATION INFORMATION Primary Insurance: 1D#: Group #:  Secondary Insurance: 1D#: Group #:  SCHEDULE INFORMATION: Date: 05/02/2023 Time: Location: [place]

## 2022-12-01 NOTE — Telephone Encounter (Signed)
Colonoscopy schedule on Monday 05/02/2023 with Dr Servando Snare at [facility]

## 2022-12-01 NOTE — Telephone Encounter (Signed)
Pt s wife Zella Ball left message to schedule husbands colonoscopy please return call

## 2022-12-20 ENCOUNTER — Ambulatory Visit: Payer: BC Managed Care – PPO | Admitting: Family Medicine

## 2022-12-28 ENCOUNTER — Ambulatory Visit: Payer: BC Managed Care – PPO | Admitting: Family Medicine

## 2022-12-28 ENCOUNTER — Encounter: Payer: Self-pay | Admitting: Family Medicine

## 2022-12-28 VITALS — BP 115/74 | HR 111 | Temp 98.2°F | Resp 12 | Ht 68.0 in | Wt 188.1 lb

## 2022-12-28 DIAGNOSIS — E663 Overweight: Secondary | ICD-10-CM

## 2022-12-28 DIAGNOSIS — I10 Essential (primary) hypertension: Secondary | ICD-10-CM | POA: Diagnosis not present

## 2022-12-28 DIAGNOSIS — E7800 Pure hypercholesterolemia, unspecified: Secondary | ICD-10-CM | POA: Diagnosis not present

## 2022-12-28 NOTE — Progress Notes (Signed)
I,Sulibeya S Dimas,acting as a Neurosurgeon for Shirlee Latch, MD.,have documented all relevant documentation on the behalf of Shirlee Latch, MD,as directed by  Shirlee Latch, MD while in the presence of Shirlee Latch, MD.     Established patient visit   Patient: Ryan Ayala.   DOB: 03-May-1966   57 y.o. Male  MRN: [id number] Visit Date: 12/28/2022  Today's healthcare provider: Shirlee Latch, MD   Chief Complaint  Patient presents with   Hypertension   Hyperlipidemia   Subjective    HPI   Hypertension, follow-up  BP Readings from Last 3 Encounters:  12/28/22 115/74  06/15/22 121/87  11/26/21 118/85   Wt Readings from Last 3 Encounters:  12/28/22 188 lb 1.6 oz (85.3 kg)  06/15/22 183 lb 11.2 oz (83.3 kg)  11/26/21 185 lb 9.6 oz (84.2 kg)     He was last seen for hypertension 6 months ago.  BP at that visit was 121/87. Management since that visit includes no change.  He reports excellent compliance with treatment. He is not having side effects.   Outside blood pressures are not being checked.  Pertinent labs Lab Results  Component Value Date   CHOL 168 06/15/2022   HDL 42 06/15/2022   LDLCALC 107 (H) 06/15/2022   TRIG 105 06/15/2022   CHOLHDL 4.0 06/15/2022   Lab Results  Component Value Date   NA 140 06/15/2022   K 4.2 06/15/2022   CREATININE 0.73 (L) 06/15/2022   EGFR 107 06/15/2022   GLUCOSE 92 06/15/2022   TSH 1.950 12/11/2015     The 10-year ASCVD risk score (Arnett DK, et al., 2019) is: 5.2%  --------------------------------------------------------------------------------------------------- Lipid/Cholesterol, Follow-up  Last lipid panel Other pertinent labs  Lab Results  Component Value Date   CHOL 168 06/15/2022   HDL 42 06/15/2022   LDLCALC 107 (H) 06/15/2022   TRIG 105 06/15/2022   CHOLHDL 4.0 06/15/2022   Lab Results  Component Value Date   ALT 36 06/15/2022   AST 26 06/15/2022   PLT 368 12/11/2015   TSH 1.950  12/11/2015     He was last seen for this 6 months ago.  Management since that visit includes no change.  He reports excellent compliance with treatment. He is not having side effects.   The 10-year ASCVD risk score (Arnett DK, et al., 2019) is: 5.2%  ---------------------------------------------------------------------------------------------------    Medications: Outpatient Medications Prior to Visit  Medication Sig   lisinopril-hydrochlorothiazide (ZESTORETIC) 20-12.5 MG tablet Take 2 tablets by mouth daily.   rosuvastatin (CRESTOR) 5 MG tablet Take 1 tablet (5 mg total) by mouth daily.   sildenafil (VIAGRA) 50 MG tablet Take 50 mg by mouth daily as needed.   No facility-administered medications prior to visit.    Review of Systems per HPI     Objective    BP 115/74 (BP Location: Left Arm, Patient Position: Sitting, Cuff Size: Large)   Pulse (!) 111   Temp 98.2 F (36.8 C) (Temporal)   Resp 12   Ht 5\' 8"  (1.727 m)   Wt 188 lb 1.6 oz (85.3 kg)   SpO2 98%   BMI 28.60 kg/m  BP Readings from Last 3 Encounters:  12/28/22 115/74  06/15/22 121/87  11/26/21 118/85   Wt Readings from Last 3 Encounters:  12/28/22 188 lb 1.6 oz (85.3 kg)  06/15/22 183 lb 11.2 oz (83.3 kg)  11/26/21 185 lb 9.6 oz (84.2 kg)      Physical Exam Vitals reviewed.  Constitutional:  General: He is not in acute distress.    Appearance: Normal appearance. He is not diaphoretic.  HENT:     Head: Normocephalic and atraumatic.  Eyes:     General: No scleral icterus.    Conjunctiva/sclera: Conjunctivae normal.  Cardiovascular:     Rate and Rhythm: Normal rate and regular rhythm.     Pulses: Normal pulses.     Heart sounds: Normal heart sounds. No murmur heard. Pulmonary:     Effort: Pulmonary effort is normal. No respiratory distress.     Breath sounds: Normal breath sounds. No wheezing or rhonchi.  Musculoskeletal:     Cervical back: Neck supple.     Right lower leg: No edema.      Left lower leg: No edema.  Lymphadenopathy:     Cervical: No cervical adenopathy.  Skin:    General: Skin is warm and dry.     Findings: No rash.  Neurological:     Mental Status: He is alert and oriented to person, place, and time. Mental status is at baseline.  Psychiatric:        Mood and Affect: Mood normal.        Behavior: Behavior normal.      No results found for any visits on 12/28/22.  Assessment & Plan     Problem List Items Addressed This Visit       Cardiovascular and Mediastinum   Essential (primary) hypertension - Primary    Well controlled Continue current medications Recheck metabolic panel F/u in 6 months         Other   Overweight    Discussed importance of healthy weight management Discussed diet and exercise       Hypercholesteremia    Previously well controlled Continue statin Repeat FLP and CMP        Return in about 6 months (around 06/30/2023) for CPE.      I, Shirlee Latch, MD, have reviewed all documentation for this visit. The documentation on 12/28/22 for the exam, diagnosis, procedures, and orders are all accurate and complete.   Ledarius Leeson, Marzella Schlein, MD, MPH [facility]

## 2022-12-28 NOTE — Assessment & Plan Note (Signed)
Well controlled Continue current medications Recheck metabolic panel F/u in 6 months  

## 2022-12-28 NOTE — Assessment & Plan Note (Signed)
Discussed importance of healthy weight management Discussed diet and exercise  

## 2022-12-28 NOTE — Assessment & Plan Note (Signed)
Previously well controlled Continue statin Repeat FLP and CMP  

## 2022-12-30 LAB — COMPREHENSIVE METABOLIC PANEL
ALT: 48 IU/L — ABNORMAL HIGH (ref 0–44)
AST: 23 IU/L (ref 0–40)
Albumin/Globulin Ratio: 1.6 (ref 1.2–2.2)
Albumin: 4.1 g/dL (ref 3.8–4.9)
Alkaline Phosphatase: 107 IU/L (ref 44–121)
BUN/Creatinine Ratio: 18 (ref 9–20)
BUN: 12 mg/dL (ref 6–24)
Bilirubin Total: 0.6 mg/dL (ref 0.0–1.2)
CO2: 21 mmol/L (ref 20–29)
Calcium: 9.2 mg/dL (ref 8.7–10.2)
Chloride: 103 mmol/L (ref 96–106)
Creatinine, Ser: 0.68 mg/dL — ABNORMAL LOW (ref 0.76–1.27)
Globulin, Total: 2.6 g/dL (ref 1.5–4.5)
Glucose: 91 mg/dL (ref 70–99)
Potassium: 4.2 mmol/L (ref 3.5–5.2)
Sodium: 138 mmol/L (ref 134–144)
Total Protein: 6.7 g/dL (ref 6.0–8.5)
eGFR: 110 mL/min/{1.73_m2} (ref >59)

## 2022-12-30 LAB — LIPID PANEL
Chol/HDL Ratio: 4 ratio (ref 0.0–5.0)
Cholesterol, Total: 169 mg/dL (ref 100–199)
HDL: 42 mg/dL (ref >39)
LDL Chol Calc (NIH): 105 mg/dL — ABNORMAL HIGH (ref 0–99)
Triglycerides: 121 mg/dL (ref 0–149)
VLDL Cholesterol Cal: 22 mg/dL (ref 5–40)

## 2023-04-19 ENCOUNTER — Encounter: Payer: Self-pay | Admitting: Gastroenterology

## 2023-04-20 ENCOUNTER — Other Ambulatory Visit: Payer: Self-pay | Admitting: *Deleted

## 2023-04-20 MED ORDER — NA SULFATE-K SULFATE-MG SULF 17.5-3.13-1.6 GM/177ML PO SOLN: 1.0000 | Freq: Once | ORAL | 0 refills | Status: AC

## 2023-05-02 ENCOUNTER — Encounter
Admission: RE | Disposition: A | Discharge status: Home / Self Care | Payer: Self-pay | Source: Home / Self Care | Attending: Gastroenterology

## 2023-05-02 ENCOUNTER — Ambulatory Visit
Admission: RE | Admit: 2023-05-02 | Discharge: 2023-05-02 | Disposition: A | Discharge status: Home / Self Care | Payer: BC Managed Care – PPO | Attending: Gastroenterology | Admitting: Gastroenterology

## 2023-05-02 ENCOUNTER — Encounter: Payer: Self-pay | Admitting: Gastroenterology

## 2023-05-02 ENCOUNTER — Ambulatory Visit: Payer: BC Managed Care – PPO | Admitting: Anesthesiology

## 2023-05-02 ENCOUNTER — Other Ambulatory Visit: Payer: Self-pay

## 2023-05-02 DIAGNOSIS — Z8249 Family history of ischemic heart disease and other diseases of the circulatory system: Secondary | ICD-10-CM | POA: Diagnosis not present

## 2023-05-02 DIAGNOSIS — Z1211 Encounter for screening for malignant neoplasm of colon: Secondary | ICD-10-CM | POA: Diagnosis present

## 2023-05-02 DIAGNOSIS — I10 Essential (primary) hypertension: Secondary | ICD-10-CM | POA: Insufficient documentation

## 2023-05-02 DIAGNOSIS — K640 First degree hemorrhoids: Secondary | ICD-10-CM | POA: Insufficient documentation

## 2023-05-02 HISTORY — PX: COLONOSCOPY WITH PROPOFOL: SHX5780

## 2023-05-02 SURGERY — COLONOSCOPY WITH PROPOFOL
Anesthesia: General | Site: Rectum

## 2023-05-02 MED ORDER — PHENYLEPHRINE HCL (PRESSORS) 10 MG/ML IV SOLN
INTRAVENOUS | Status: DC | PRN
Start: 2023-05-02 — End: 2023-05-02
  Administered 2023-05-02: 100 ug via INTRAVENOUS

## 2023-05-02 MED ORDER — LIDOCAINE HCL (CARDIAC) PF 100 MG/5ML IV SOSY
PREFILLED_SYRINGE | INTRAVENOUS | Status: DC | PRN
  Administered 2023-05-02: 50 mg via INTRAVENOUS

## 2023-05-02 MED ORDER — PROPOFOL 10 MG/ML IV BOLUS
INTRAVENOUS | Status: DC | PRN
  Administered 2023-05-02 (×4): 40 mg via INTRAVENOUS
  Administered 2023-05-02: 90 mg via INTRAVENOUS

## 2023-05-02 MED ORDER — SODIUM CHLORIDE 0.9 % IV SOLN: INTRAVENOUS | Status: DC

## 2023-05-02 MED ORDER — LACTATED RINGERS IV SOLN: INTRAVENOUS | Status: DC

## 2023-05-02 MED ORDER — STERILE WATER FOR IRRIGATION IR SOLN
Status: DC | PRN
  Administered 2023-05-02: 1

## 2023-05-02 SURGICAL SUPPLY — 21 items

## 2023-05-02 NOTE — Anesthesia Preprocedure Evaluation (Signed)
Anesthesia Evaluation  Patient identified by MRN, date of birth, ID band Patient awake    Reviewed: Allergy & Precautions, H&P , NPO status , Patient's Chart, lab work & pertinent test results  Airway Mallampati: IV  TM Distance: <3 FB     Dental   Upper bridge, nothing removable :   Pulmonary neg pulmonary ROS          Cardiovascular hypertension,      Neuro/Psych negative neurological ROS  negative psych ROS   GI/Hepatic negative GI ROS, Neg liver ROS,,,  Endo/Other  negative endocrine ROS    Renal/GU negative Renal ROS  negative genitourinary   Musculoskeletal negative musculoskeletal ROS (+)    Abdominal   Peds negative pediatric ROS (+)  Hematology negative hematology ROS (+)   Anesthesia Other Findings   Reproductive/Obstetrics negative OB ROS                              Anesthesia Physical Anesthesia Plan  ASA: 2  Anesthesia Plan: General   Post-op Pain Management:    Induction: Intravenous  PONV Risk Score and Plan:   Airway Management Planned: Natural Airway and Nasal Cannula  Additional Equipment:   Intra-op Plan:   Post-operative Plan:   Informed Consent: I have reviewed the patients History and Physical, chart, labs and discussed the procedure including the risks, benefits and alternatives for the proposed anesthesia with the patient or authorized representative who has indicated his/her understanding and acceptance.     Dental Advisory Given  Plan Discussed with: Anesthesiologist, CRNA and Surgeon  Anesthesia Plan Comments: (Patient consented for risks of anesthesia including but not limited to:  - adverse reactions to medications - risk of airway placement if required - damage to eyes, teeth, lips or other oral mucosa - nerve damage due to positioning  - sore throat or hoarseness - Damage to heart, brain, nerves, lungs, other parts of body or loss  of life  Patient voiced understanding.)         Anesthesia Quick Evaluation

## 2023-05-02 NOTE — Anesthesia Postprocedure Evaluation (Signed)
Anesthesia Post Note  Patient: Ryan Ayala.  Procedure(s) Performed: COLONOSCOPY WITH PROPOFOL (Rectum)  Patient location during evaluation: PACU Anesthesia Type: General Level of consciousness: awake and alert Pain management: pain level controlled Vital Signs Assessment: post-procedure vital signs reviewed and stable Respiratory status: spontaneous breathing, nonlabored ventilation, respiratory function stable and patient connected to nasal cannula oxygen Cardiovascular status: blood pressure returned to baseline and stable Postop Assessment: no apparent nausea or vomiting Anesthetic complications: no   No notable events documented.   Last Vitals:  Vitals:   05/02/23 0739 05/02/23 0743  BP: 116/77   Pulse:  81  Resp: 19 12  Temp: 36.8 C   SpO2: 95% 97%    Last Pain:  Vitals:   05/02/23 0739  TempSrc:   PainSc: Asleep                 [place] Stra[place] C Willa Brocks

## 2023-05-02 NOTE — Op Note (Signed)
[facility] Gastroenterology Patient Name: Ryan Ayala Procedure Date: 05/02/2023 7:11 AM MRN: [id number] Account #: [id number] Date of Birth: [birth year] Admit Type: Outpatient Age: 57 Room: [facility] OR ROOM 01 Gender: Male Note Status: Finalized Instrument Name: [id number] Procedure:             Colonoscopy Indications:           Screening for colorectal malignant neoplasm Providers:             Midge Minium MD, MD Referring MD:          Marzella Schlein. Bacigalupo (Referring MD) Medicines:             Propofol per Anesthesia Complications:         No immediate complications. Procedure:             Pre-Anesthesia Assessment:                        - Prior to the procedure, a History and Physical was                         performed, and patient medications and allergies were                         reviewed. The patient's tolerance of previous                         anesthesia was also reviewed. The risks and benefits                         of the procedure and the sedation options and risks                         were discussed with the patient. All questions were                         answered, and informed consent was obtained. Prior                         Anticoagulants: The patient has taken no anticoagulant                         or antiplatelet agents. ASA Grade Assessment: II - A                         patient with mild systemic disease. After reviewing                         the risks and benefits, the patient was deemed in                         satisfactory condition to undergo the procedure.                        After obtaining informed consent, the colonoscope was                         passed under direct vision. Throughout the procedure,  the patient's blood pressure, pulse, and oxygen                         saturations were monitored continuously. The was                         introduced through the anus and advanced to the  the                         cecum, identified by appendiceal orifice and ileocecal                         valve. The colonoscopy was performed without                         difficulty. The patient tolerated the procedure well.                         The quality of the bowel preparation was adequate to                         identify polyps. Findings:      The perianal and digital rectal examinations were normal.      Non-bleeding internal hemorrhoids were found during retroflexion. The       hemorrhoids were Grade I (internal hemorrhoids that do not prolapse). Impression:            - Non-bleeding internal hemorrhoids.                        - No specimens collected. Recommendation:        - Discharge patient to home.                        - Resume previous diet.                        - Continue present medications. Procedure Code(s):     --- Professional ---                        [phone], Colonoscopy, flexible; diagnostic, including                         collection of specimen(s) by brushing or washing, when                         performed (separate procedure) Diagnosis Code(s):     --- Professional ---                        Z12.11, Encounter for screening for malignant neoplasm                         of colon CPT copyright 2022 American Medical Association. All rights reserved. The codes documented in this report are preliminary and upon coder review may  be revised to meet current compliance requirements. Midge Minium MD, MD 05/02/2023 7:37:40 AM This report has been signed electronically. Number of Addenda: 0 Note Initiated On: 05/02/2023 7:11 AM Scope Withdrawal Time: 0 hours 8 minutes 55 seconds  Total  Procedure Duration: 0 hours 12 minutes 6 seconds  Estimated Blood Loss:  Estimated blood loss: none.      [facility]

## 2023-05-02 NOTE — H&P (Signed)
Midge Minium, MD [facility] [address] [place], [place] [address] Phone: [phone] Fax : [phone]  Primary Care Physician:  Erasmo Downer, MD Primary Gastroenterologist:  Dr. Servando Snare  Pre-Procedure History & Physical: HPI:  Ryan Ayala. is a 57 y.o. male is here for a screening colonoscopy.   Past Medical History:  Diagnosis Date   Hypertension     Past Surgical History:  Procedure Laterality Date   VASECTOMY      Prior to Admission medications   Medication Sig Start Date End Date Taking? Authorizing Provider  lisinopril-hydrochlorothiazide (ZESTORETIC) 20-12.5 MG tablet Take 2 tablets by mouth daily. 06/15/22  Yes Bacigalupo, Marzella Schlein, MD  rosuvastatin (CRESTOR) 5 MG tablet Take 1 tablet (5 mg total) by mouth daily. 06/15/22  Yes Bacigalupo, Marzella Schlein, MD  sildenafil (VIAGRA) 50 MG tablet Take 50 mg by mouth daily as needed.   Yes [provider]    Allergies as of 12/01/2022   (No Known Allergies)    Family History  Problem Relation Age of Onset   Emphysema Mother    Heart failure Father    Cancer Father        "on his spine"   Healthy Sister    Heart attack Paternal Grandfather 2   Colon cancer Neg Hx    Prostate cancer Neg Hx     Social History   Socioeconomic History   Marital status: Married    Spouse name: Zella Ball   Number of children: 3   Years of education: 12   Highest education level: High school graduate  Occupational History   Not on file  Tobacco Use   Smoking status: Never   Smokeless tobacco: Never  Vaping Use   Vaping status: Never Used  Substance and Sexual Activity   Alcohol use: Yes    Comment: rarely   Drug use: No   Sexual activity: Yes    Birth control/protection: Surgical  Other Topics Concern   Not on file  Social History Narrative   Not on file   Social Determinants of Health   Financial Resource Strain: Low Risk  (10/24/2017)   Overall Financial Resource Strain (CARDIA)    Difficulty of  Paying Living Expenses: Not hard at all  Food Insecurity: No Food Insecurity (10/24/2017)   Hunger Vital Sign    Worried About Running Out of Food in the Last Year: Never true    Ran Out of Food in the Last Year: Never true  Transportation Needs: No Transportation Needs (10/24/2017)   PRAPARE - Administrator, Civil Service (Medical): No    Lack of Transportation (Non-Medical): No  Physical Activity: Not on file  Stress: Not on file  Social Connections: Not on file  Intimate Partner Violence: Not on file    Review of Systems: See HPI, otherwise negative ROS  Physical Exam: BP 135/82   Pulse 97   Temp 98.2 F (36.8 C) (Temporal)   Resp 12   Ht 5\' 8"  (1.727 m)   Wt 76.2 kg   SpO2 96%   BMI 25.56 kg/m  General:   Alert,  pleasant and cooperative in NAD Head:  Normocephalic and atraumatic. Neck:  Supple; no masses or thyromegaly. Lungs:  Clear throughout to auscultation.    Heart:  Regular rate and rhythm. Abdomen:  Soft, nontender and nondistended. Normal bowel sounds, without guarding, and without rebound.   Neurologic:  Alert and  oriented x4;  grossly normal neurologically.  Impression/Plan:  Ryan Ceo. is now here to undergo a screening colonoscopy.  Risks, benefits, and alternatives regarding colonoscopy have been reviewed with the patient.  Questions have been answered.  All parties agreeable.

## 2023-05-02 NOTE — Transfer of Care (Signed)
Immediate Anesthesia Transfer of Care Note  Patient: Ryan Ayala.  Procedure(s) Performed: COLONOSCOPY WITH PROPOFOL (Rectum)  Patient Location: PACU  Anesthesia Type: General  Level of Consciousness: awake, alert  and patient cooperative  Airway and Oxygen Therapy: Patient Spontanous Breathing and Patient connected to supplemental oxygen  Post-op Assessment: Post-op Vital signs reviewed, Patient's Cardiovascular Status Stable, Respiratory Function Stable, Patent Airway and No signs of Nausea or vomiting  Post-op Vital Signs: Reviewed and stable  Complications: No notable events documented.

## 2023-06-15 ENCOUNTER — Other Ambulatory Visit: Payer: Self-pay | Admitting: Family Medicine

## 2023-06-15 DIAGNOSIS — E7800 Pure hypercholesterolemia, unspecified: Secondary | ICD-10-CM

## 2023-06-15 NOTE — Telephone Encounter (Signed)
Requested Prescriptions  Pending Prescriptions Disp Refills   lisinopril-hydrochlorothiazide (ZESTORETIC) 20-12.5 MG tablet [Pharmacy Med Name: LISINOPRIL-HCTZ 20/12.5MG  TABLETS] 180 tablet 0    Sig: TAKE 2 TABLETS BY MOUTH DAILY     Cardiovascular:  ACEI + Diuretic Combos Failed - 06/15/2023  8:52 AM      Failed - Cr in normal range and within 180 days    Creat  Date Value Ref Range Status  04/25/2017 0.70 0.70 - 1.33 mg/dL Final    Comment:    For patients >28 years of age, the reference limit for Creatinine is approximately 13% higher for people identified as African-American. .    Creatinine, Ser  Date Value Ref Range Status  12/29/2022 0.68 (L) 0.76 - 1.27 mg/dL Final         Failed - Last BP in normal range    BP Readings from Last 1 Encounters:  05/02/23 (!) 121/90         Passed - Na in normal range and within 180 days    Sodium  Date Value Ref Range Status  12/29/2022 138 134 - 144 mmol/L Final         Passed - K in normal range and within 180 days    Potassium  Date Value Ref Range Status  12/29/2022 4.2 3.5 - 5.2 mmol/L Final         Passed - eGFR is 30 or above and within 180 days    GFR calc Af Amer  Date Value Ref Range Status  10/24/2019 129 >59 mL/min/1.73 Final   GFR calc non Af Amer  Date Value Ref Range Status  10/24/2019 112 >59 mL/min/1.73 Final   eGFR  Date Value Ref Range Status  12/29/2022 110 >59 mL/min/1.73 Final         Passed - Patient is not pregnant      Passed - Valid encounter within last 6 months    Recent Outpatient Visits           5 months ago Essential (primary) hypertension   [place] [facility] [place], Marzella Schlein, MD   1 year ago Encounter for annual physical exam   [place] [facility] [place], Marzella Schlein, MD   1 year ago Essential (primary) hypertension   [place] [facility] [place], Marzella Schlein, MD   2 years ago Encounter for annual physical exam    [place] [facility] [place], Marzella Schlein, MD   3 years ago Essential (primary) hypertension   [place] [facility] [place], Marzella Schlein, MD       Future Appointments             In 1 week Bacigalupo, Marzella Schlein, MD [place] [facility]             rosuvastatin (CRESTOR) 5 MG tablet [Pharmacy Med Name: ROSUVASTATIN 5MG  TABLETS] 90 tablet 3    Sig: TAKE 1 TABLET(5 MG) BY MOUTH DAILY     Cardiovascular:  Antilipid - Statins 2 Failed - 06/15/2023  8:52 AM      Failed - Cr in normal range and within 360 days    Creat  Date Value Ref Range Status  04/25/2017 0.70 0.70 - 1.33 mg/dL Final    Comment:    For patients >30 years of age, the reference limit for Creatinine is approximately 13% higher for people identified as African-American. .    Creatinine, Ser  Date Value Ref Range  Status  12/29/2022 0.68 (L) 0.76 - 1.27 mg/dL Final         Failed - Lipid Panel in normal range within the last 12 months    Cholesterol, Total  Date Value Ref Range Status  12/29/2022 169 100 - 199 mg/dL Final   LDL Cholesterol (Calc)  Date Value Ref Range Status  04/25/2017 142 (H) mg/dL (calc) Final    Comment:    Reference range: <100 . Desirable range <100 mg/dL for primary prevention;   <70 mg/dL for patients with CHD or diabetic patients  with > or = 2 CHD risk factors. Marland Kitchen LDL-C is now calculated using the Martin-Hopkins  calculation, which is a validated novel method providing  better accuracy than the Friedewald equation in the  estimation of LDL-C.  Horald Pollen et al. Lenox Ahr. 1610;960(45): 2061-2068  (http://education.QuestDiagnostics.com/faq/FAQ164)    LDL Chol Calc (NIH)  Date Value Ref Range Status  12/29/2022 105 (H) 0 - 99 mg/dL Final   HDL  Date Value Ref Range Status  12/29/2022 42 >39 mg/dL Final   Triglycerides  Date Value Ref Range Status  12/29/2022 121 0 - 149 mg/dL Final         Passed - Patient is  not pregnant      Passed - Valid encounter within last 12 months    Recent Outpatient Visits           5 months ago Essential (primary) hypertension   [facility] [place], Marzella Schlein, MD   1 year ago Encounter for annual physical exam   [place] [facility] [place], Marzella Schlein, MD   1 year ago Essential (primary) hypertension   [place] [facility] [place], Marzella Schlein, MD   2 years ago Encounter for annual physical exam   [place] [facility] [place], Marzella Schlein, MD   3 years ago Essential (primary) hypertension   [place] [place], Marzella Schlein, MD       Future Appointments             In 1 week Bacigalupo, Marzella Schlein, MD [facility]

## 2023-06-17 ENCOUNTER — Encounter: Payer: BC Managed Care – PPO | Admitting: Family Medicine

## 2023-06-20 ENCOUNTER — Encounter: Payer: BC Managed Care – PPO | Admitting: Family Medicine

## 2023-06-28 ENCOUNTER — Ambulatory Visit (INDEPENDENT_AMBULATORY_CARE_PROVIDER_SITE_OTHER): Payer: BC Managed Care – PPO | Admitting: Family Medicine

## 2023-06-28 ENCOUNTER — Encounter: Payer: Self-pay | Admitting: Family Medicine

## 2023-06-28 VITALS — BP 134/84 | HR 90 | Ht 68.0 in | Wt 185.5 lb

## 2023-06-28 DIAGNOSIS — E663 Overweight: Secondary | ICD-10-CM

## 2023-06-28 DIAGNOSIS — Z0000 Encounter for general adult medical examination without abnormal findings: Secondary | ICD-10-CM

## 2023-06-28 DIAGNOSIS — Z7189 Other specified counseling: Secondary | ICD-10-CM

## 2023-06-28 DIAGNOSIS — I10 Essential (primary) hypertension: Secondary | ICD-10-CM

## 2023-06-28 DIAGNOSIS — E7800 Pure hypercholesterolemia, unspecified: Secondary | ICD-10-CM

## 2023-06-28 DIAGNOSIS — R0982 Postnasal drip: Secondary | ICD-10-CM

## 2023-06-28 MED ORDER — LISINOPRIL-HYDROCHLOROTHIAZIDE 20-12.5 MG PO TABS: 2.0000 | ORAL_TABLET | Freq: Every day | ORAL | 3 refills | Status: DC

## 2023-06-28 MED ORDER — ROSUVASTATIN CALCIUM 5 MG PO TABS
5.0000 mg | ORAL_TABLET | Freq: Every day | ORAL | 3 refills | Status: DC
Start: 2023-06-28 — End: 2023-12-27

## 2023-06-28 NOTE — Assessment & Plan Note (Signed)
Cholesterol levels last checked six months ago. Well-managed with Crestor 5 mg daily. No reported issues with medication. - Order cholesterol panel - Continue Crestor, 5 mg daily

## 2023-06-28 NOTE — Progress Notes (Signed)
Complete physical exam  Patient: Ryan Ayala.   DOB: [birth year]   57 y.o. Male  MRN: [id number]  Subjective:    Chief Complaint  Patient presents with   Annual Exam    The patient reports trying to consume a well balanced diet. 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 Patient reports feeling well and sleeping well. He reports no concerns.    Ryan Ayala. is a 57 y.o. male who presents today for a complete physical exam.  Discussed the use of AI scribe software for clinical note transcription with the patient, who gave verbal consent to proceed.  History of Present Illness   Ryan Ayala, a patient with a history of hypertension and hyperlipidemia, presents for a routine physical examination. He reports no issues with his current medications, Lisinopril HCTZ and Crestor. He recently underwent a colonoscopy, which returned normal results, and he is not due for another for ten years. He has not received a flu shot this year. He mentions a recent incident where he burned his lip with hot sauce, which is healing with the use of chapstick and Vaseline. In the mornings, he experiences phlegm]p. He has not noticed any other respiratory symptoms or discomfort. He also discusses his weight, expressing a desire to lose weight and reach a goal of 165 pounds.        Most recent fall risk assessment:    12/28/2022    2:12 PM  Fall Risk   Falls in the past year? 0  Number falls in past yr: 0  Injury with Fall? 0  Risk for fall due to : No Fall Risks  Follow up Falls evaluation completed     Most recent depression screenings:    12/28/2022    2:12 PM 06/15/2022    9:23 AM  PHQ 2/9 Scores  PHQ - 2 Score 0 0  PHQ- 9 Score 0 0        Patient Care Team: Erasmo Downer, MD as PCP - General (Family Medicine)   Outpatient Medications Prior to Visit  Medication Sig   sildenafil (VIAGRA) 50 MG tablet Take 50 mg by mouth daily  as needed.   [DISCONTINUED] lisinopril-hydrochlorothiazide (ZESTORETIC) 20-12.5 MG tablet TAKE 2 TABLETS BY MOUTH DAILY   [DISCONTINUED] rosuvastatin (CRESTOR) 5 MG tablet TAKE 1 TABLET(5 MG) BY MOUTH DAILY   No facility-administered medications prior to visit.    ROS      Objective:     BP 134/84 (BP Location: Right Arm, Patient Position: Sitting, Cuff Size: Normal)   Pulse 90   Ht 5\' 8"  (1.727 m)   Wt 185 lb 8 oz (84.1 kg)   BMI 28.21 kg/m    Physical Exam Vitals reviewed.  Constitutional:      General: He is not in acute distress.    Appearance: Normal appearance. He is well-developed. He is not diaphoretic.  HENT:     Head: Normocephalic and atraumatic.     Right Ear: Tympanic membrane, ear canal and external ear normal.     Left Ear: Tympanic membrane, ear canal and external ear normal.     Nose: Nose normal.     Mouth/Throat:     Mouth: Mucous membranes are moist.     Pharynx: Oropharynx is clear. No oropharyngeal exudate.  Eyes:     General: No scleral icterus.    Conjunctiva/sclera: Conjunctivae normal.     Pupils:  Pupils are equal, round, and reactive to light.  Neck:     Thyroid: No thyromegaly.  Cardiovascular:     Rate and Rhythm: Normal rate and regular rhythm.     Heart sounds: Normal heart sounds. No murmur heard. Pulmonary:     Effort: Pulmonary effort is normal. No respiratory distress.     Breath sounds: Normal breath sounds. No wheezing or rales.  Abdominal:     General: There is no distension.     Palpations: Abdomen is soft.     Tenderness: There is no abdominal tenderness.  Musculoskeletal:        General: No deformity.     Cervical back: Neck supple.     Right lower leg: No edema.     Left lower leg: No edema.  Lymphadenopathy:     Cervical: No cervical adenopathy.  Skin:    General: Skin is warm and dry.     Findings: No rash.  Neurological:     Mental Status: He is alert and oriented to person, place, and time. Mental status is at  baseline.     Gait: Gait normal.  Psychiatric:        Mood and Affect: Mood normal.        Behavior: Behavior normal.        Thought Content: Thought content normal.      No results found for any visits on 06/28/23.     Assessment & Plan:    Routine Health Maintenance and Physical Exam  Immunization History  Administered Date(s) Administered   Tdap 12/10/2015    Health Maintenance  Topic Date Due   Zoster Vaccines- Shingrix (1 of 2) Never done   COVID-19 Vaccine (1 - 2023-24 season) Never done   INFLUENZA VACCINE  11/14/2023 (Originally 03/17/2023)   DTaP/Tdap/Td (2 - Td or Tdap) 12/09/2025   Colonoscopy  05/01/2033   Hepatitis C Screening  Completed   HIV Screening  Completed   HPV VACCINES  Aged Out    Discussed health benefits of physical activity, and encouraged him to engage in regular exercise appropriate for his age and condition.  Problem List Items Addressed This Visit       Cardiovascular and Mediastinum   Essential (primary) hypertension   Relevant Medications   lisinopril-hydrochlorothiazide (ZESTORETIC) 20-12.5 MG tablet   rosuvastatin (CRESTOR) 5 MG tablet   Other Relevant Orders   Comprehensive metabolic panel   Lipid panel     Other   Overweight   Hypercholesteremia   Relevant Medications   lisinopril-hydrochlorothiazide (ZESTORETIC) 20-12.5 MG tablet   rosuvastatin (CRESTOR) 5 MG tablet   Other Relevant Orders   Comprehensive metabolic panel   Lipid panel   Other Visit Diagnoses     Encounter for annual physical exam    -  Primary   Counseling regarding advance care planning and goals of care [Z71.89]               Postnasal Drip Reports coughing up clear phlegm in the morning. Likely due to postnasal drip. Advised that this is common and usually not concerning. Flonase at night can help reduce symptoms. - Recommend Flonase at night if bothersome  Burn Injury Sustained a burn on the lip from hot barbecue sauce. Currently managing  with chapstick and Vaseline. - Continue using chapstick and Vaseline for lip burn  General Health Maintenance Up to date on colonoscopy with next one due in ten years. No flu shot received this year. Discussed the importance of  the shingles vaccine. - declines flu and shingles vaccinations  Goals of Care Discussed the importance of having a living will and ensuring family is aware of healthcare wishes. Has had prior discussions with family but does not have a formal living will. Emphasized the importance of specifying wishes regarding life support and long-term ventilator use. - Encourage discussion with family about healthcare wishes - Consider creating a living will  Follow-up - Schedule six-month follow-up for blood pressure check - Provide lab slip for cholesterol, kidney, and liver function tests.       Return in about 6 months (around 12/26/2023) for chronic disease f/u.     Shirlee Latch, MD

## 2023-06-28 NOTE — Assessment & Plan Note (Signed)
Blood pressure today is 134/84 mmHg. Well-managed with lisinopril HCTZ (two pills daily) and Crestor (5 mg daily). No reported issues with medications. - Continue lisinopril HCTZ, two pills daily - Continue Crestor, 5 mg daily - Schedule six-month follow-up for blood pressure check

## 2023-06-28 NOTE — Assessment & Plan Note (Signed)
Weight Management Current weight is 185 lbs. Goal weight is 165 lbs. Discussed benefits of gradual weight loss and setting attainable goals. A 5% weight loss (9 lbs) can improve morbidity and mortality risk. - Encourage gradual weight loss and healthy eating habits

## 2023-06-29 LAB — LIPID PANEL
Chol/HDL Ratio: 4.3 ratio (ref 0.0–5.0)
Cholesterol, Total: 192 mg/dL (ref 100–199)
HDL: 45 mg/dL (ref >39)
LDL Chol Calc (NIH): 128 mg/dL — ABNORMAL HIGH (ref 0–99)
Triglycerides: 103 mg/dL (ref 0–149)
VLDL Cholesterol Cal: 19 mg/dL (ref 5–40)

## 2023-06-29 LAB — COMPREHENSIVE METABOLIC PANEL
ALT: 48 [IU]/L — ABNORMAL HIGH (ref 0–44)
AST: 26 [IU]/L (ref 0–40)
Albumin: 4.3 g/dL (ref 3.8–4.9)
Alkaline Phosphatase: 113 [IU]/L (ref 44–121)
BUN/Creatinine Ratio: 16 (ref 9–20)
BUN: 11 mg/dL (ref 6–24)
Bilirubin Total: 0.5 mg/dL (ref 0.0–1.2)
CO2: 23 mmol/L (ref 20–29)
Calcium: 10 mg/dL (ref 8.7–10.2)
Chloride: 104 mmol/L (ref 96–106)
Creatinine, Ser: 0.7 mg/dL — ABNORMAL LOW (ref 0.76–1.27)
Globulin, Total: 2.9 g/dL (ref 1.5–4.5)
Glucose: 97 mg/dL (ref 70–99)
Potassium: 4.4 mmol/L (ref 3.5–5.2)
Sodium: 141 mmol/L (ref 134–144)
Total Protein: 7.2 g/dL (ref 6.0–8.5)
eGFR: 108 mL/min/{1.73_m2} (ref >59)

## 2023-07-07 ENCOUNTER — Encounter: Payer: BC Managed Care – PPO | Admitting: Family Medicine

## 2023-09-25 ENCOUNTER — Other Ambulatory Visit: Payer: Self-pay | Admitting: Family Medicine

## 2023-09-26 NOTE — Telephone Encounter (Signed)
 Rx 06/28/23 #180 3RF - too soon Requested Prescriptions  Pending Prescriptions Disp Refills   lisinopril -hydrochlorothiazide  (ZESTORETIC ) 20-12.5 MG tablet [Pharmacy Med Name: LISINOPRIL -HCTZ 20/12.5MG  TABLETS] 180 tablet 3    Sig: TAKE 2 TABLETS BY MOUTH DAILY     Cardiovascular:  ACEI + Diuretic Combos Failed - 09/26/2023  3:45 PM      Failed - Cr in normal range and within 180 days    Creat  Date Value Ref Range Status  04/25/2017 0.70 0.70 - 1.33 mg/dL Final    Comment:    For patients >63 years of age, the reference limit for Creatinine is approximately 13% higher for people identified as African-American. .    Creatinine, Ser  Date Value Ref Range Status  06/28/2023 0.70 (L) 0.76 - 1.27 mg/dL Final         Passed - Na in normal range and within 180 days    Sodium  Date Value Ref Range Status  06/28/2023 141 134 - 144 mmol/L Final         Passed - K in normal range and within 180 days    Potassium  Date Value Ref Range Status  06/28/2023 4.4 3.5 - 5.2 mmol/L Final         Passed - eGFR is 30 or above and within 180 days    GFR calc Af Amer  Date Value Ref Range Status  10/24/2019 129 >59 mL/min/1.73 Final   GFR calc non Af Amer  Date Value Ref Range Status  10/24/2019 112 >59 mL/min/1.73 Final   eGFR  Date Value Ref Range Status  06/28/2023 108 >59 mL/min/1.73 Final         Passed - Patient is not pregnant      Passed - Last BP in normal range    BP Readings from Last 1 Encounters:  06/28/23 134/84         Passed - Valid encounter within last 6 months    Recent Outpatient Visits           3 months ago Encounter for annual physical exam   [facility] [place], Stan Eans, MD   9 months ago Essential (primary) hypertension   [place] [facility] [place], Stan Eans, MD   1 year ago Encounter for annual physical exam   [facility] [place], Stan Eans, MD   1 year ago Essential  (primary) hypertension   [place] [facility] [place], Stan Eans, MD   2 years ago Encounter for annual physical exam   [place] [facility] [place], Stan Eans, MD       Future Appointments             In 3 months Bacigalupo, Stan Eans, MD [facility]

## 2023-12-26 ENCOUNTER — Telehealth: Payer: Self-pay | Admitting: Family Medicine

## 2023-12-26 DIAGNOSIS — E7800 Pure hypercholesterolemia, unspecified: Secondary | ICD-10-CM

## 2023-12-26 NOTE — Telephone Encounter (Signed)
 Spoke with pharmacy. Technician verbalizes patient has refills available but he just picked up a 30 day supply. Technician Wynonia Hedges advised to tell patient to make sure he physically request 90 supply to be dispensed since that is what rx is written for but looks like their system has been defaulting him to a 30 day supply.

## 2023-12-26 NOTE — Telephone Encounter (Signed)
Duplicate request  Please see other telephone encounter

## 2023-12-26 NOTE — Telephone Encounter (Signed)
 [facility] pharmacy is requesting refill rosuvastatin  (CRESTOR ) 5 MG tablet  Please advise

## 2023-12-26 NOTE — Telephone Encounter (Signed)
[facility] pharmacy faxed refill request for the following medications:  rosuvastatin (CRESTOR) 5 MG tablet    Please advise  

## 2023-12-26 NOTE — Telephone Encounter (Signed)
Duplicate encounter. Please see other telephone encounter.

## 2023-12-27 ENCOUNTER — Other Ambulatory Visit: Payer: Self-pay

## 2023-12-27 ENCOUNTER — Ambulatory Visit: Payer: Self-pay | Admitting: Family Medicine

## 2023-12-27 ENCOUNTER — Telehealth: Payer: Self-pay | Admitting: Family Medicine

## 2023-12-27 DIAGNOSIS — E7800 Pure hypercholesterolemia, unspecified: Secondary | ICD-10-CM

## 2023-12-27 MED ORDER — ROSUVASTATIN CALCIUM 5 MG PO TABS: 5.0000 mg | ORAL_TABLET | Freq: Every day | ORAL | 0 refills | Status: DC

## 2023-12-27 NOTE — Telephone Encounter (Signed)
 [facility] pharmacy is requesting refill rosuvastatin  (CESTOR) 5 MG tablet  Please advise

## 2024-01-03 ENCOUNTER — Encounter: Payer: Self-pay | Admitting: Family Medicine

## 2024-01-03 ENCOUNTER — Telehealth: Admitting: Family Medicine

## 2024-01-03 VITALS — BP 136/69

## 2024-01-03 DIAGNOSIS — N529 Male erectile dysfunction, unspecified: Secondary | ICD-10-CM

## 2024-01-03 DIAGNOSIS — E7800 Pure hypercholesterolemia, unspecified: Secondary | ICD-10-CM | POA: Diagnosis not present

## 2024-01-03 DIAGNOSIS — I10 Essential (primary) hypertension: Secondary | ICD-10-CM

## 2024-01-03 MED ORDER — ROSUVASTATIN CALCIUM 5 MG PO TABS: 5.0000 mg | ORAL_TABLET | Freq: Every day | ORAL | 3 refills | Status: AC

## 2024-01-03 MED ORDER — LISINOPRIL-HYDROCHLOROTHIAZIDE 20-12.5 MG PO TABS: 2.0000 | ORAL_TABLET | Freq: Every day | ORAL | 3 refills | Status: AC

## 2024-01-03 NOTE — Progress Notes (Signed)
 MyChart Video Visit    Virtual Visit via Video Note   This format is felt to be most appropriate for this patient at this time. Physical exam was limited by quality of the video and audio technology used for the visit.    Patient location: home Provider location: [facility] Persons involved in the visit: patient, provider  I discussed the limitations of evaluation and management by telemedicine and the availability of in person appointments. The patient expressed understanding and agreed to proceed.  Patient: Ryan Ayala.   DOB: May 20, 1966   58 y.o. Male  MRN: [id number] Visit Date: 01/03/2024  Today's healthcare provider: Aden Agreste, MD   Chief Complaint  Patient presents with   Follow-up   Subjective    HPI   Discussed the use of AI scribe software for clinical note transcription with the patient, who gave verbal consent to proceed.  History of Present Illness   Ryan Ayala. is a 58 year old male with hypertension and hyperlipidemia who presents for a six month follow-up.  He is currently taking lisinopril -HCTZ 20-12.5 mg, two tablets daily for hypertension. 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 For hyperlipidemia, he is on Crestor  5 mg daily, with a lipid panel planned to assess treatment effectiveness.  He weighs approximately 187 pounds and aims to reduce his weight to around 170 pounds. During a recent DOT physical, his neck circumference was measured at 16 inches. He has a history of erectile dysfunction for which he takes Viagra 50 mg as needed, with no current issues or need for a refill.        Review of Systems      Objective    BP 136/69 Comment: home reading      Physical Exam Constitutional:      General: He is not in acute distress.    Appearance: Normal appearance. He is not diaphoretic.  HENT:     Head: Normocephalic.  Eyes:     Conjunctiva/sclera: Conjunctivae  normal.  Pulmonary:     Effort: Pulmonary effort is normal. No respiratory distress.  Neurological:     Mental Status: He is alert and oriented to person, place, and time. Mental status is at baseline.        Assessment & Plan     Problem List Items Addressed This Visit       Cardiovascular and Mediastinum   Essential (primary) hypertension - Primary   Relevant Medications   lisinopril -hydrochlorothiazide  (ZESTORETIC ) 20-12.5 MG tablet   rosuvastatin  (CRESTOR ) 5 MG tablet   Other Relevant Orders   Comprehensive metabolic panel with GFR     Other   ED (erectile dysfunction) of organic origin   Hypercholesteremia   Relevant Medications   lisinopril -hydrochlorothiazide  (ZESTORETIC ) 20-12.5 MG tablet   rosuvastatin  (CRESTOR ) 5 MG tablet   Other Relevant Orders   Comprehensive metabolic panel with GFR   Lipid panel    Meds ordered this encounter  Medications   lisinopril -hydrochlorothiazide  (ZESTORETIC ) 20-12.5 MG tablet    Sig: Take 2 tablets by mouth daily.    Dispense:  180 tablet    Refill:  3   rosuvastatin  (CRESTOR ) 5 MG tablet    Sig: Take 1 tablet (5 mg total) by mouth daily.    Dispense:  90 tablet    Refill:  3     Return in about 6 months (around 07/05/2024) for CPE.     I  discussed the assessment and treatment plan with the patient. The patient was provided an opportunity to ask questions and all were answered. The patient agreed with the plan and demonstrated an understanding of the instructions.   The patient was advised to call back or seek an in-person evaluation if the symptoms worsen or if the condition fails to improve as anticipated.   Aden Agreste, MD [facility] [phone] (phone) [phone] (fax)  [facility]

## 2024-01-03 NOTE — Assessment & Plan Note (Signed)
 Hypertension is well-controlled with current medication regimen. 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 - Continue current antihypertensive regimen of Lisinopril -HCTZ 20-12.5 mg, two tablets daily.

## 2024-01-03 NOTE — Assessment & Plan Note (Signed)
 Erectile dysfunction is managed with Viagra 50 mg as needed. No current issues or need for refill reported. - Continue current management with Viagra 50 mg as needed.

## 2024-01-03 NOTE — Assessment & Plan Note (Signed)
 Hyperlipidemia management is ongoing with Crestor  5 mg daily. Current lipid levels are unknown and require evaluation through laboratory testing. - Order lipid panel to assess current cholesterol levels. - Send refill for Crestor  to pharmacy.

## 2024-03-23 LAB — COMPREHENSIVE METABOLIC PANEL WITH GFR
ALT: 39 IU/L (ref 0–44)
AST: 24 IU/L (ref 0–40)
Albumin: 4.1 g/dL (ref 3.8–4.9)
Alkaline Phosphatase: 99 IU/L (ref 44–121)
BUN/Creatinine Ratio: 18 (ref 9–20)
BUN: 12 mg/dL (ref 6–24)
Bilirubin Total: 0.6 mg/dL (ref 0.0–1.2)
CO2: 23 mmol/L (ref 20–29)
Calcium: 9.6 mg/dL (ref 8.7–10.2)
Chloride: 105 mmol/L (ref 96–106)
Creatinine, Ser: 0.68 mg/dL — ABNORMAL LOW (ref 0.76–1.27)
Globulin, Total: 2.8 g/dL (ref 1.5–4.5)
Glucose: 96 mg/dL (ref 70–99)
Potassium: 4.8 mmol/L (ref 3.5–5.2)
Sodium: 141 mmol/L (ref 134–144)
Total Protein: 6.9 g/dL (ref 6.0–8.5)
eGFR: 108 mL/min/1.73 (ref >59)

## 2024-03-23 LAB — LIPID PANEL
Chol/HDL Ratio: 4.6 ratio (ref 0.0–5.0)
Cholesterol, Total: 169 mg/dL (ref 100–199)
HDL: 37 mg/dL — ABNORMAL LOW (ref >39)
LDL Chol Calc (NIH): 99 mg/dL (ref 0–99)
Triglycerides: 190 mg/dL — ABNORMAL HIGH (ref 0–149)
VLDL Cholesterol Cal: 33 mg/dL (ref 5–40)

## 2024-03-26 ENCOUNTER — Ambulatory Visit: Payer: Self-pay | Admitting: Family Medicine
